# Patient Record
Sex: Female | Born: 1985 | Race: Black or African American | Marital: Single | Smoking: Never smoker
Health system: Southern US, Community
[De-identification: ages and names within clinical notes are randomized; demographics above are authoritative.]

## PROBLEM LIST (undated history)

## (undated) DIAGNOSIS — I10 Essential (primary) hypertension: Secondary | ICD-10-CM

## (undated) DIAGNOSIS — G35 Multiple sclerosis: Secondary | ICD-10-CM

## (undated) HISTORY — PX: ABDOMINAL SURGERY: SHX537

## (undated) HISTORY — DX: Multiple sclerosis: G35

---

## 2015-02-14 ENCOUNTER — Emergency Department
Admission: EM | Admit: 2015-02-14 | Discharge: 2015-02-14 | Disposition: A | Discharge status: Home / Self Care | Payer: Self-pay | Attending: Emergency Medicine | Admitting: Emergency Medicine

## 2015-02-14 ENCOUNTER — Encounter: Payer: Self-pay | Admitting: Emergency Medicine

## 2015-02-14 DIAGNOSIS — N760 Acute vaginitis: Secondary | ICD-10-CM | POA: Insufficient documentation

## 2015-02-14 DIAGNOSIS — A6009 Herpesviral infection of other urogenital tract: Secondary | ICD-10-CM | POA: Insufficient documentation

## 2015-02-14 DIAGNOSIS — Z3202 Encounter for pregnancy test, result negative: Secondary | ICD-10-CM | POA: Insufficient documentation

## 2015-02-14 DIAGNOSIS — R42 Dizziness and giddiness: Secondary | ICD-10-CM | POA: Insufficient documentation

## 2015-02-14 DIAGNOSIS — Z880 Allergy status to penicillin: Secondary | ICD-10-CM | POA: Insufficient documentation

## 2015-02-14 DIAGNOSIS — I10 Essential (primary) hypertension: Secondary | ICD-10-CM | POA: Insufficient documentation

## 2015-02-14 HISTORY — DX: Essential (primary) hypertension: I10

## 2015-02-14 LAB — COMPREHENSIVE METABOLIC PANEL
ALT: 21 U/L (ref 14–54)
AST: 19 U/L (ref 15–41)
Albumin: 4 g/dL (ref 3.5–5.0)
Alkaline Phosphatase: 69 U/L (ref 38–126)
Anion gap: 5 (ref 5–15)
BUN: 17 mg/dL (ref 6–20)
CHLORIDE: 105 mmol/L (ref 101–111)
CO2: 26 mmol/L (ref 22–32)
Calcium: 9.6 mg/dL (ref 8.9–10.3)
Creatinine, Ser: 1.03 mg/dL — ABNORMAL HIGH (ref 0.44–1.00)
Glucose, Bld: 102 mg/dL — ABNORMAL HIGH (ref 65–99)
POTASSIUM: 3.6 mmol/L (ref 3.5–5.1)
SODIUM: 136 mmol/L (ref 135–145)
Total Bilirubin: 0.5 mg/dL (ref 0.3–1.2)
Total Protein: 8.1 g/dL (ref 6.5–8.1)

## 2015-02-14 LAB — URINALYSIS COMPLETE WITH MICROSCOPIC (ARMC ONLY)
BILIRUBIN URINE: NEGATIVE
Glucose, UA: NEGATIVE mg/dL
Ketones, ur: NEGATIVE mg/dL
Leukocytes, UA: NEGATIVE
Nitrite: NEGATIVE
PH: 6 (ref 5.0–8.0)
PROTEIN: 30 mg/dL — AB
Specific Gravity, Urine: 1.027 (ref 1.005–1.030)

## 2015-02-14 LAB — CHLAMYDIA/NGC RT PCR (ARMC ONLY)
Chlamydia Tr: NOT DETECTED
N gonorrhoeae: NOT DETECTED

## 2015-02-14 LAB — CBC
HEMATOCRIT: 40.4 % (ref 35.0–47.0)
HEMOGLOBIN: 13.9 g/dL (ref 12.0–16.0)
MCH: 31.2 pg (ref 26.0–34.0)
MCHC: 34.3 g/dL (ref 32.0–36.0)
MCV: 91 fL (ref 80.0–100.0)
Platelets: 282 10*3/uL (ref 150–440)
RBC: 4.44 MIL/uL (ref 3.80–5.20)
RDW: 13.1 % (ref 11.5–14.5)
WBC: 7.3 10*3/uL (ref 3.6–11.0)

## 2015-02-14 LAB — WET PREP, GENITAL
Trich, Wet Prep: NEGATIVE — AB
Yeast Wet Prep HPF POC: NEGATIVE — AB

## 2015-02-14 LAB — LIPASE, BLOOD: LIPASE: 32 U/L (ref 11–51)

## 2015-02-14 LAB — POCT PREGNANCY, URINE: PREG TEST UR: NEGATIVE

## 2015-02-14 MED ORDER — ACYCLOVIR 400 MG PO TABS: 400.0000 mg | ORAL_TABLET | Freq: Every day | ORAL | Status: AC

## 2015-02-14 MED ORDER — SODIUM CHLORIDE 0.9 % IV SOLN
Freq: Once | INTRAVENOUS | Status: AC
  Administered 2015-02-14: 18:00:00 via INTRAVENOUS

## 2015-02-14 NOTE — ED Provider Notes (Signed)
[facility] Emergency Department Provider Note  ____________________________________________  Time seen: On arrival  I have reviewed the triage vital signs and the nursing notes.   HISTORY  Chief Complaint Fever; Dizziness; and Abdominal Pain    HPI Terry Ramos is a 29 y.o. female who presents with complaints of lower abdominal cramping with yellow discharge. She reports lesions on her vagina and she is very concerned about STD. She reports new sexual partner approximately 2 weeks ago with unprotected sexual intercourse. She denies nausea or vomiting. She is very anxious and concerned    Past Medical History  Diagnosis Date  . Hypertension     There are no active problems to display for this patient.   Past Surgical History  Procedure Laterality Date  . Abdominal surgery      Current Outpatient Rx  Name  Route  Sig  Dispense  Refill  . acyclovir (ZOVIRAX) 400 MG tablet   Oral   Take 1 tablet (400 mg total) by mouth 5 (five) times daily.   50 tablet   0     Allergies Penicillins  No family history on file.  Social History Social History  Substance Use Topics  . Smoking status: Never Smoker   . Smokeless tobacco: None  . Alcohol Use: No    Review of Systems  Constitutional: Negative for fever. Eyes: Negative for visual changes. ENT: Negative for sore throat   Genitourinary: Negative for dysuria. Musculoskeletal: Negative for back pain. Skin: Negative for rash. Neurological: Negative for headaches or focal weakness   ____________________________________________   PHYSICAL EXAM:  VITAL SIGNS: ED Triage Vitals  Enc Vitals Group     BP 02/14/15 1538 173/124 mmHg     Pulse Rate 02/14/15 1538 119     Resp 02/14/15 1538 19     Temp 02/14/15 1538 99.1 F (37.3 C)     Temp Source 02/14/15 1538 Oral     SpO2 02/14/15 1538 98 %     Weight 02/14/15 1538 160 lb (72.576 kg)     Height 02/14/15 1538 5\' 4"  (1.626 m)   Head Cir --      Peak Flow --      Pain Score 02/14/15 1542 8     Pain Loc --      Pain Edu? --      Excl. in [place]? --      Constitutional: Alert and oriented. Well appearing and in no distress. Eyes: Conjunctivae are normal.  ENT   Head: Normocephalic and atraumatic.   Mouth/Throat: Mucous membranes are moist. Cardiovascular: Normal rate, regular rhythm.  Respiratory: Normal respiratory effort without tachypnea nor retractions.  Gastrointestinal: Soft and non-tender in all quadrants. No distention. There is no CVA tenderness. GU: Significant clear liquid discharge, reddish lesions on the outside and inside of the vagina speculum exam was very painful for the patient. No CMT noted Musculoskeletal: Nontender with normal range of motion in all extremities. Neurologic:  Normal speech and language. No gross focal neurologic deficits are appreciated. Skin:  Skin is warm, dry and intact. No rash noted. Psychiatric: Mood and affect are normal. Patient exhibits appropriate insight and judgment.  ____________________________________________    LABS (pertinent positives/negatives)  Labs Reviewed  WET PREP, GENITAL - Abnormal; Notable for the following:    Yeast Wet Prep HPF POC NEGATIVE (*)    Trich, Wet Prep NEGATIVE (*)    Clue Cells Wet Prep HPF POC FEW (*)    WBC, Wet Prep HPF POC  MANY (*)    All other components within normal limits  COMPREHENSIVE METABOLIC PANEL - Abnormal; Notable for the following:    Glucose, Bld 102 (*)    Creatinine, Ser 1.03 (*)    All other components within normal limits  URINALYSIS COMPLETEWITH MICROSCOPIC ([facility] ONLY) - Abnormal; Notable for the following:    Color, Urine YELLOW (*)    APPearance CLEAR (*)    Hgb urine dipstick 2+ (*)    Protein, ur 30 (*)    Bacteria, UA RARE (*)    Squamous Epithelial / LPF 0-5 (*)    All other components within normal limits  CHLAMYDIA/NGC RT PCR ([facility] ONLY)  HSV CULTURE AND TYPING  LIPASE,  BLOOD  CBC  POC URINE PREG, ED  POCT PREGNANCY, URINE    ____________________________________________     ____________________________________________    RADIOLOGY I have personally reviewed any xrays that were ordered on this patient: None  ____________________________________________   PROCEDURES  Procedure(s) performed: none   ____________________________________________   INITIAL IMPRESSION / ASSESSMENT AND PLAN / ED COURSE  Pertinent labs & imaging results that were available during my care of the patient were reviewed by me and considered in my medical decision making (see chart for details).  Clinical exam is consistent with herpes genitalis. We will send cultures. Chlamydia and gonorrhea are negative. I have treated her presumptively for herpes with acyclovir. I have encouraged her to follow up with health department for further testing  ____________________________________________   FINAL CLINICAL IMPRESSION(S) / ED DIAGNOSES  Final diagnoses:  Vaginitis  Herpes genitalis in women     Lavonia Drafts, MD 02/14/15 2350

## 2015-02-14 NOTE — ED Notes (Addendum)
Pt reports fever x3 days, dizziness, lower abdominal cramping with yellow discharge. Pt reports new sexual partner 2 weekends ago. Pt reports her menstrual cycle is late. Pt also reports new bumps to pubic area.

## 2015-02-14 NOTE — ED Notes (Signed)
Patient with no complaints at this time. Respirations even and unlabored. Skin warm/dry. Discharge instructions reviewed with patient at this time. Patient given opportunity to voice concerns/ask questions. IV removed per policy and band-aid applied to site. Patient discharged at this time and left Emergency Department with steady gait.  

## 2015-02-14 NOTE — Discharge Instructions (Signed)
Herpes Simplex Test There are two common types of herpes simplex virus (HSV). These are classified as Type 1 (HSV1) or Type 2 (HSV2). Type 1 often causes cold sores on or around the mouth and sometimes on or around the eyes. Type 2 is commonly known as a sexually transmitted infection that causes sores in and around the genitals. Both types of herpes simplex can cause sores in different areas. There are two types of herpes simplex tests. These include:  Culture. This consists of collecting and testing a sample of fluid with a cotton swab from an open sore. This can only be done during an active infection (outbreak). Culture tests take several days to complete but are very accurate.  HSV blood tests. This test is not as accurate as a culture. However, HSV blood tests are faster than cultures, often providing a test result within one day.  HSV antibody test. This checks for the presence of antibodies against HSV in your blood. Antibodies are proteins your body makes to help fight infection.  HSV antigen test. This checks for the presence of the HSV germ (antigen) in your blood. Your health care provider may recommend you have a HSV test if:  He or she believes you have a HSV infection.  You have a weakened immune system (immunocompromised) and you have sores around your mouth or genitals that look like HSV eruptions.  You have a fever of unknown origin (FUO).  You are pregnant, have herpes, and are expecting to deliver a baby vaginally in the next 6-8 weeks. RESULTS It is your responsibility to obtain your test results. Ask the lab or department performing the test when and how you will get your results. Contact your health care provider to discuss any questions you have about your results. Range of Normal Values Ranges for normal values may vary among different labs and hospitals. You should always check with your health care provider after having lab work or other tests done to discuss whether  your values are considered within normal limits. Normal findings include:  No HSV antigen or antibodies present in your blood.  No HSV antigen present in cultured fluid. Meaning of Results Outside Normal Ranges The following test results may indicate that you have an active HSV infection:  Positive culture for HSV1 or HSV2.  Presence of HSV1 or HSV2 antigens in your blood.  Presence of certain HSV1 or HSV2 antibodies (IgM) in your blood. Discuss your test results with your health care provider. He or she will use the results to make a diagnosis and determine a treatment plan that is right for you.   This information is not intended to replace advice given to you by your health care provider. Make sure you discuss any questions you have with your health care provider.   Document Released: 04/30/2004 Document Revised: 04/18/2014 Document Reviewed: 08/13/2013 Elsevier Interactive Patient Education [employer].

## 2015-02-18 LAB — HSV CULTURE AND TYPING

## 2018-12-10 ENCOUNTER — Other Ambulatory Visit (HOSPITAL_COMMUNITY): Payer: Self-pay | Admitting: Ophthalmology

## 2018-12-10 ENCOUNTER — Other Ambulatory Visit: Payer: Self-pay | Admitting: Ophthalmology

## 2018-12-10 DIAGNOSIS — H5500 Unspecified nystagmus: Secondary | ICD-10-CM

## 2018-12-12 ENCOUNTER — Ambulatory Visit
Admission: RE | Admit: 2018-12-12 | Discharge: 2018-12-12 | Disposition: A | Discharge status: Home / Self Care | Payer: 59 | Source: Ambulatory Visit | Attending: Ophthalmology | Admitting: Ophthalmology

## 2018-12-12 ENCOUNTER — Other Ambulatory Visit: Payer: Self-pay

## 2018-12-12 DIAGNOSIS — H5500 Unspecified nystagmus: Secondary | ICD-10-CM | POA: Insufficient documentation

## 2018-12-12 LAB — POCT I-STAT CREATININE: Creatinine, Ser: 1.3 mg/dL — ABNORMAL HIGH (ref 0.44–1.00)

## 2018-12-12 IMAGING — MR MR HEAD WO/W CM
15 series · 48 of 48 positions shown · IV contrast (agent unspecified)
Comparison: None.

CLINICAL DATA: Nystagmus

EXAM:
MRI HEAD WITHOUT AND WITH CONTRAST
TECHNIQUE: Multiplanar, multiecho pulse sequences of the brain and surrounding
structures were obtained without and with intravenous contrast.
CONTRAST:  7 mL Gadovist IV

[Series 5: ax dwi_tracew · axial · 3.0mm · 0.60mm/px · z∈[-118,+37]mm · 4 of 48 slices shown]
[im 1/48]
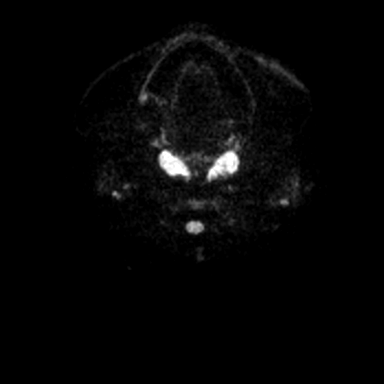
[im 16/48]
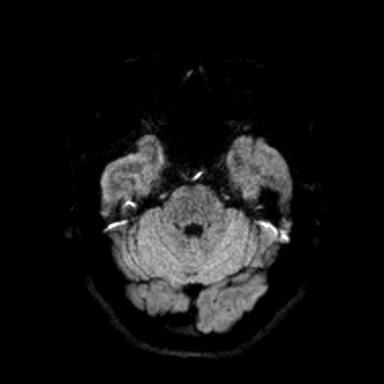
[im 32/48]
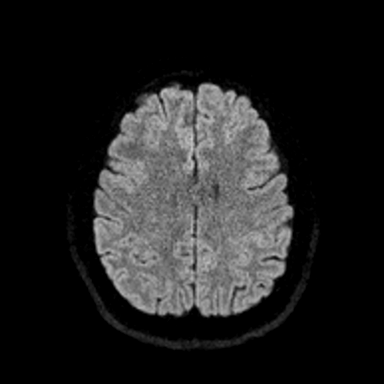
[im 48/48]
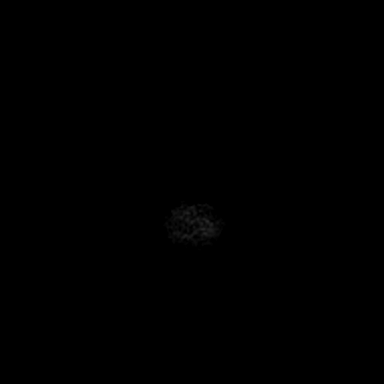

[Series 6: ax dwi_adc · axial · 3.0mm · 0.60mm/px · z∈[-118,+37]mm · 3 of 48 slices shown]
[im 1/48]
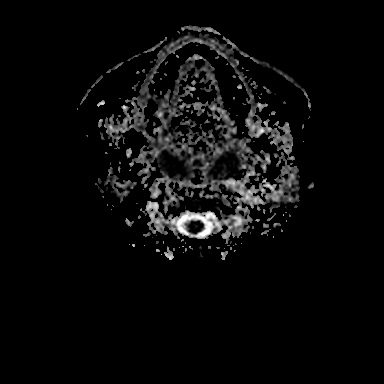
[im 24/48]
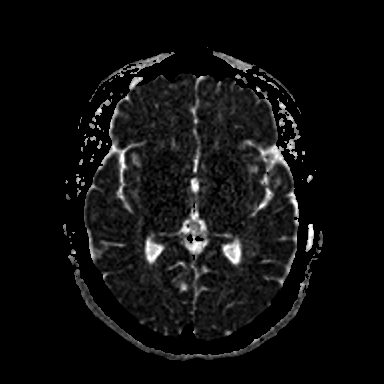
[im 48/48]
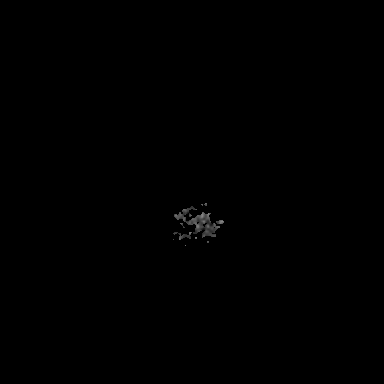

[Series 7: cor dwi_tracew · coronal · 5.0mm · 0.60mm/px · 2 of 40 slices shown]
[im 1/40]
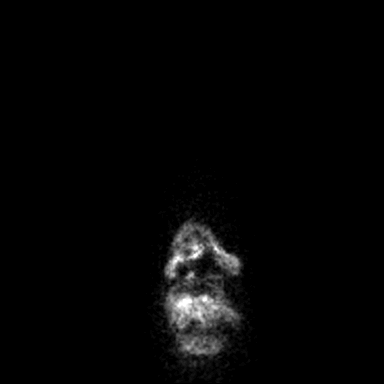
[im 40/40]
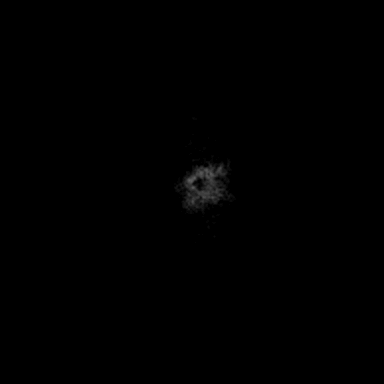

[Series 8: cor dwi_adc · coronal · 5.0mm · 0.60mm/px · 2 of 39 slices shown]
[im 1/39]
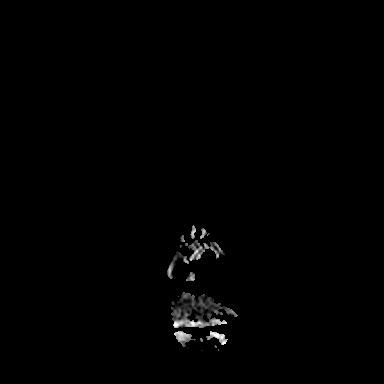
[im 39/39]
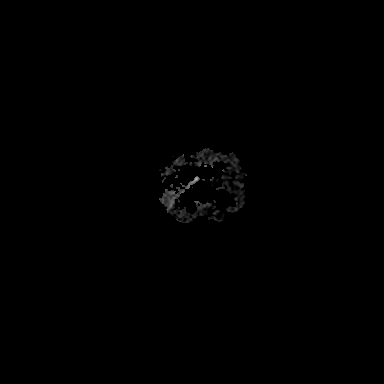

[Series 9: T1 · sagittal · 5.0mm · 0.62mm/px · 1 of 23 slices shown (1 of 2)]
[im 1/23]
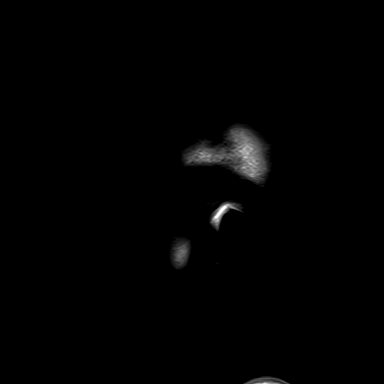

[Series 10: T2 · axial · 5.0mm · 0.53mm/px · 1 of 25 slices shown]
[im 1/25]
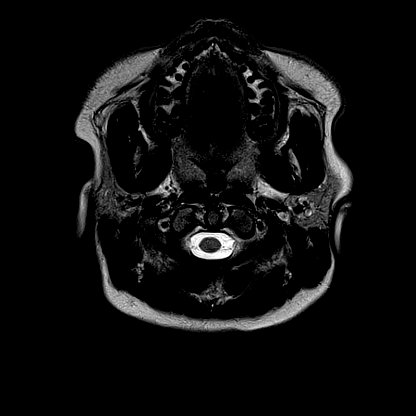

[Series 13: swi_images · axial · 3.0mm · 0.90mm/px · z∈[-129,+47]mm · 3 of 60 slices shown]
[im 1/60]
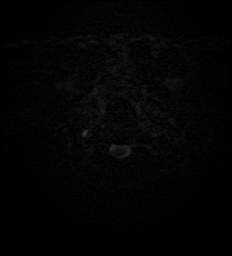
[im 30/60]
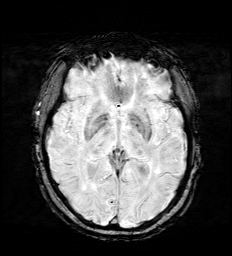
[im 60/60]
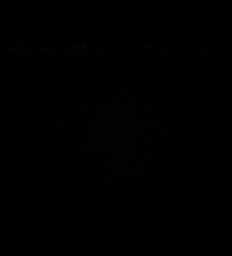

[Series 14: mip_images(sw) · axial · 24.0mm · 0.90mm/px · z∈[-119,+37]mm · 3 of 53 slices shown]
[im 1/53]
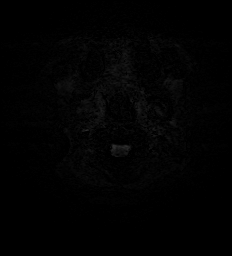
[im 27/53]
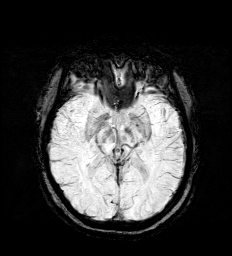
[im 53/53]
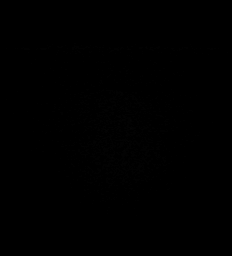

[Series 15: FLAIR · axial · 3.0mm · 0.53mm/px · z∈[-121,+40]mm · 3 of 55 slices shown (1 of 2)]
[im 1/55]
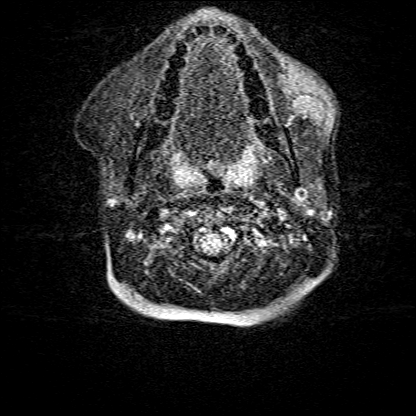
[im 28/55]
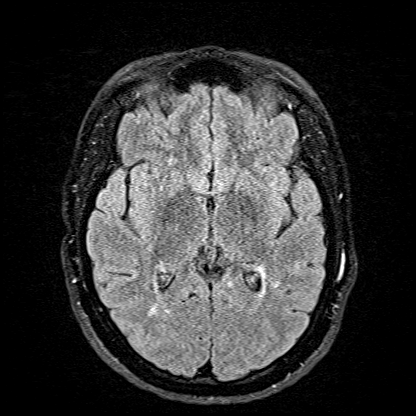
[im 55/55]
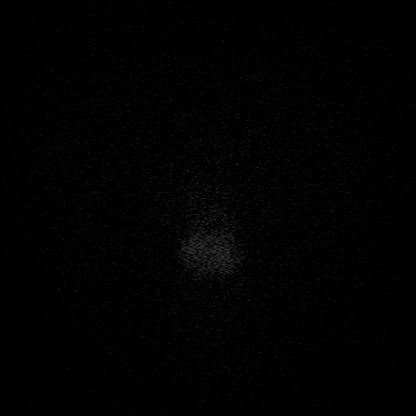

[Series 16: T1 · axial · 1.0mm · 0.98mm/px · z∈[-129,+46]mm · 10 of 176 slices shown (2 of 2)]
[im 1/176]
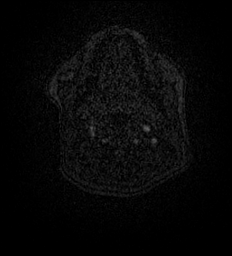
[im 20/176]
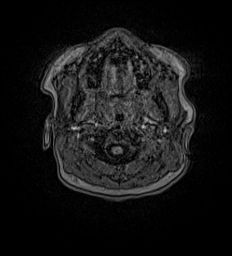
[im 39/176]
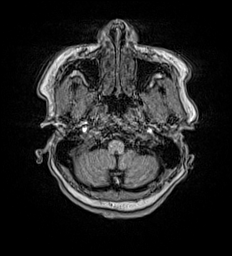
[im 59/176]
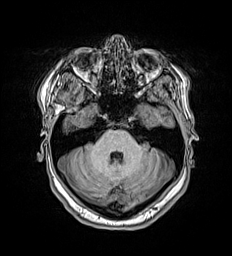
[im 78/176]
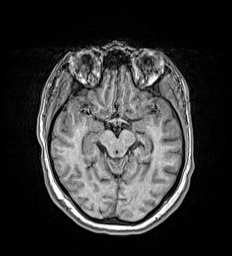
[im 98/176]
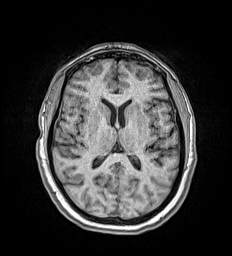
[im 117/176]
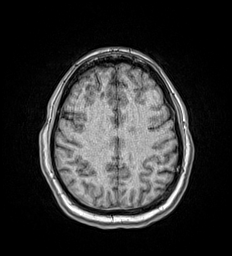
[im 137/176]
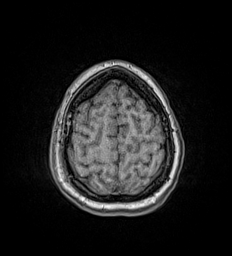
[im 156/176]
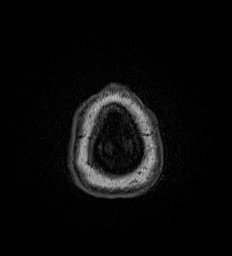
[im 176/176]
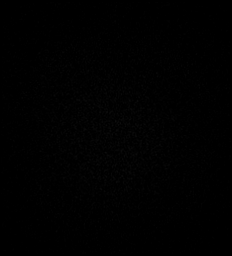

[Series 17: FLAIR · sagittal · 5.0mm · 0.94mm/px · 1 of 20 slices shown (2 of 2)]
[im 1/20]
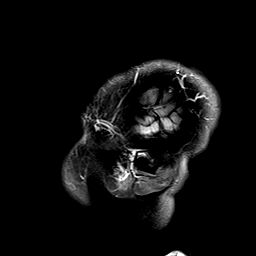

[Series 18: T2 post-contrast · coronal · 5.0mm · 0.57mm/px · 2 of 29 slices shown]
[im 1/29]
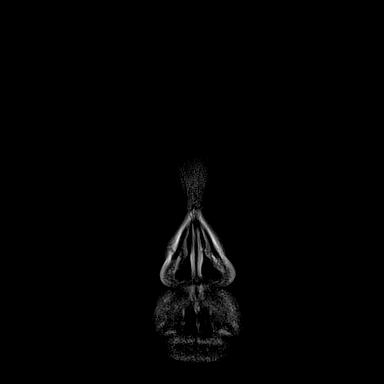
[im 29/29]
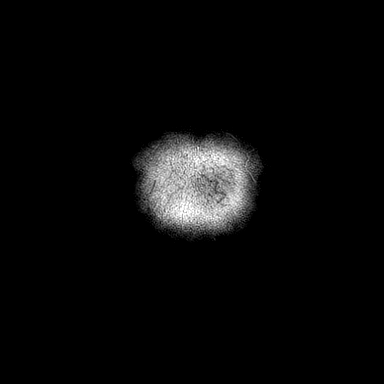

[Series 19: T1 post-contrast · axial · 1.0mm · 0.98mm/px · z∈[-129,+46]mm · 10 of 176 slices shown (1 of 3)]
[im 1/176]
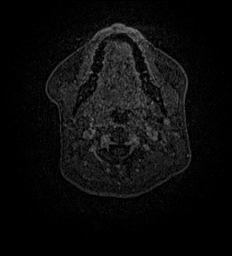
[im 20/176]
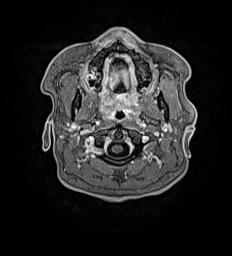
[im 39/176]
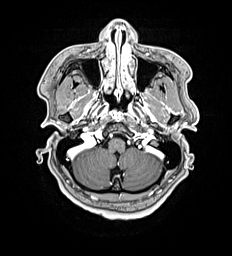
[im 59/176]
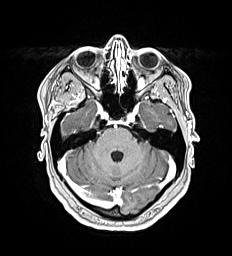
[im 78/176]
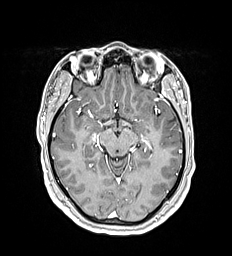
[im 98/176]
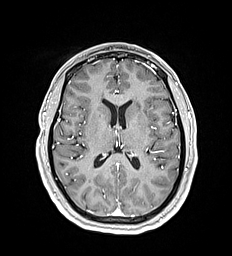
[im 117/176]
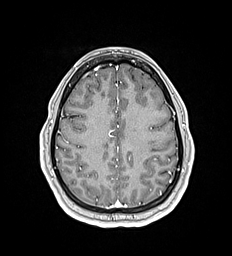
[im 137/176]
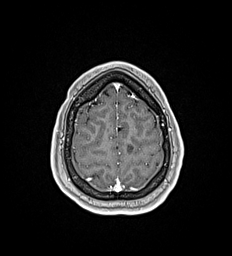
[im 156/176]
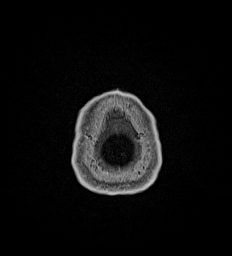
[im 176/176]
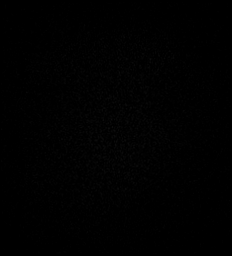

[Series 20: T1 post-contrast · coronal · 5.0mm · 0.57mm/px · 2 of 29 slices shown (2 of 3)]
[im 1/29]
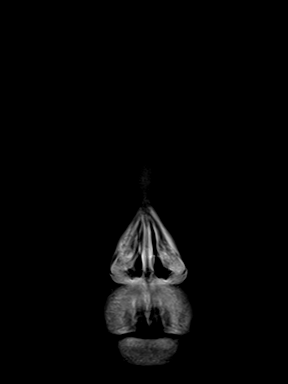
[im 29/29]
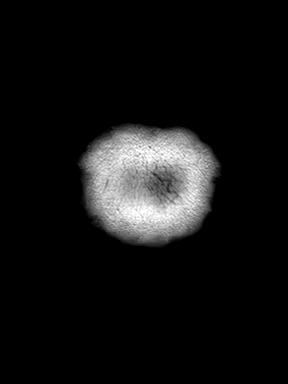

[Series 21: T1 post-contrast · sagittal · 5.0mm · 0.62mm/px · 1 of 23 slices shown (3 of 3)]
[im 1/23]
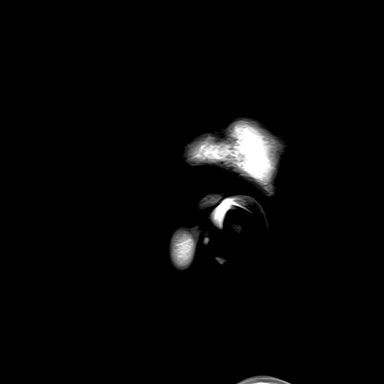

[48 of 48 positions shown; findings below may reference images not displayed]

FINDINGS: Brain: Multiple white matter lesions are present bilaterally.
Lesions are present in the periventricular and deep white matter. No
lesions show restricted diffusion or abnormal enhancement. No
lesions in the posterior fossa. Approximately 20 lesions are
identified

Ventricle size normal.  Negative for acute infarct or mass.

Vascular: Normal arterial flow voids.

Skull and upper cervical spine: Negative

Sinuses/Orbits: Negative

Other: None
IMPRESSION: Multiple white matter lesions are present. Given the symptoms and
patient's age, multiple sclerosis is most likely. No lesions are
present in the brainstem.

## 2018-12-12 MED ORDER — GADOBUTROL 1 MMOL/ML IV SOLN
7.0000 mL | Freq: Once | INTRAVENOUS | Status: AC | PRN
  Administered 2018-12-12: 10:00:00 7 mL via INTRAVENOUS

## 2019-11-22 ENCOUNTER — Emergency Department: Payer: 59

## 2019-11-22 ENCOUNTER — Other Ambulatory Visit: Payer: Self-pay

## 2019-11-22 ENCOUNTER — Emergency Department
Admission: EM | Admit: 2019-11-22 | Discharge: 2019-11-22 | Disposition: A | Discharge status: Home / Self Care | Payer: 59 | Attending: Emergency Medicine | Admitting: Emergency Medicine

## 2019-11-22 DIAGNOSIS — R0789 Other chest pain: Secondary | ICD-10-CM

## 2019-11-22 DIAGNOSIS — I10 Essential (primary) hypertension: Secondary | ICD-10-CM | POA: Diagnosis not present

## 2019-11-22 LAB — TROPONIN I (HIGH SENSITIVITY)
Troponin I (High Sensitivity): 5 ng/L (ref <18)
Troponin I (High Sensitivity): 4 ng/L (ref <18)

## 2019-11-22 LAB — BASIC METABOLIC PANEL
Anion gap: 8 (ref 5–15)
BUN: 20 mg/dL (ref 6–20)
CO2: 25 mmol/L (ref 22–32)
Calcium: 9 mg/dL (ref 8.9–10.3)
Chloride: 106 mmol/L (ref 98–111)
Creatinine, Ser: 0.96 mg/dL (ref 0.44–1.00)
GFR calc Af Amer: >60 mL/min (ref >60)
GFR calc non Af Amer: >60 mL/min (ref >60)
Glucose, Bld: 94 mg/dL (ref 70–99)
Potassium: 3.4 mmol/L — ABNORMAL LOW (ref 3.5–5.1)
Sodium: 139 mmol/L (ref 135–145)

## 2019-11-22 LAB — CBC
HCT: 37 % (ref 36.0–46.0)
Hemoglobin: 12.7 g/dL (ref 12.0–15.0)
MCH: 31 pg (ref 26.0–34.0)
MCHC: 34.3 g/dL (ref 30.0–36.0)
MCV: 90.2 fL (ref 80.0–100.0)
Platelets: 329 10*3/uL (ref 150–400)
RBC: 4.1 MIL/uL (ref 3.87–5.11)
RDW: 12.6 % (ref 11.5–15.5)
WBC: 9.1 10*3/uL (ref 4.0–10.5)
nRBC: 0 % (ref 0.0–0.2)

## 2019-11-22 IMAGING — CR DG CHEST 2V
1 series · 2 of 2 positions shown · non-contrast
Comparison: None.

CLINICAL DATA: Chest pressure for 2 days

EXAM:
CHEST - 2 VIEW

[Series 1: dg chest 2 view · 0.14mm/px · 2 of 2 slices shown]
[im 1/2]
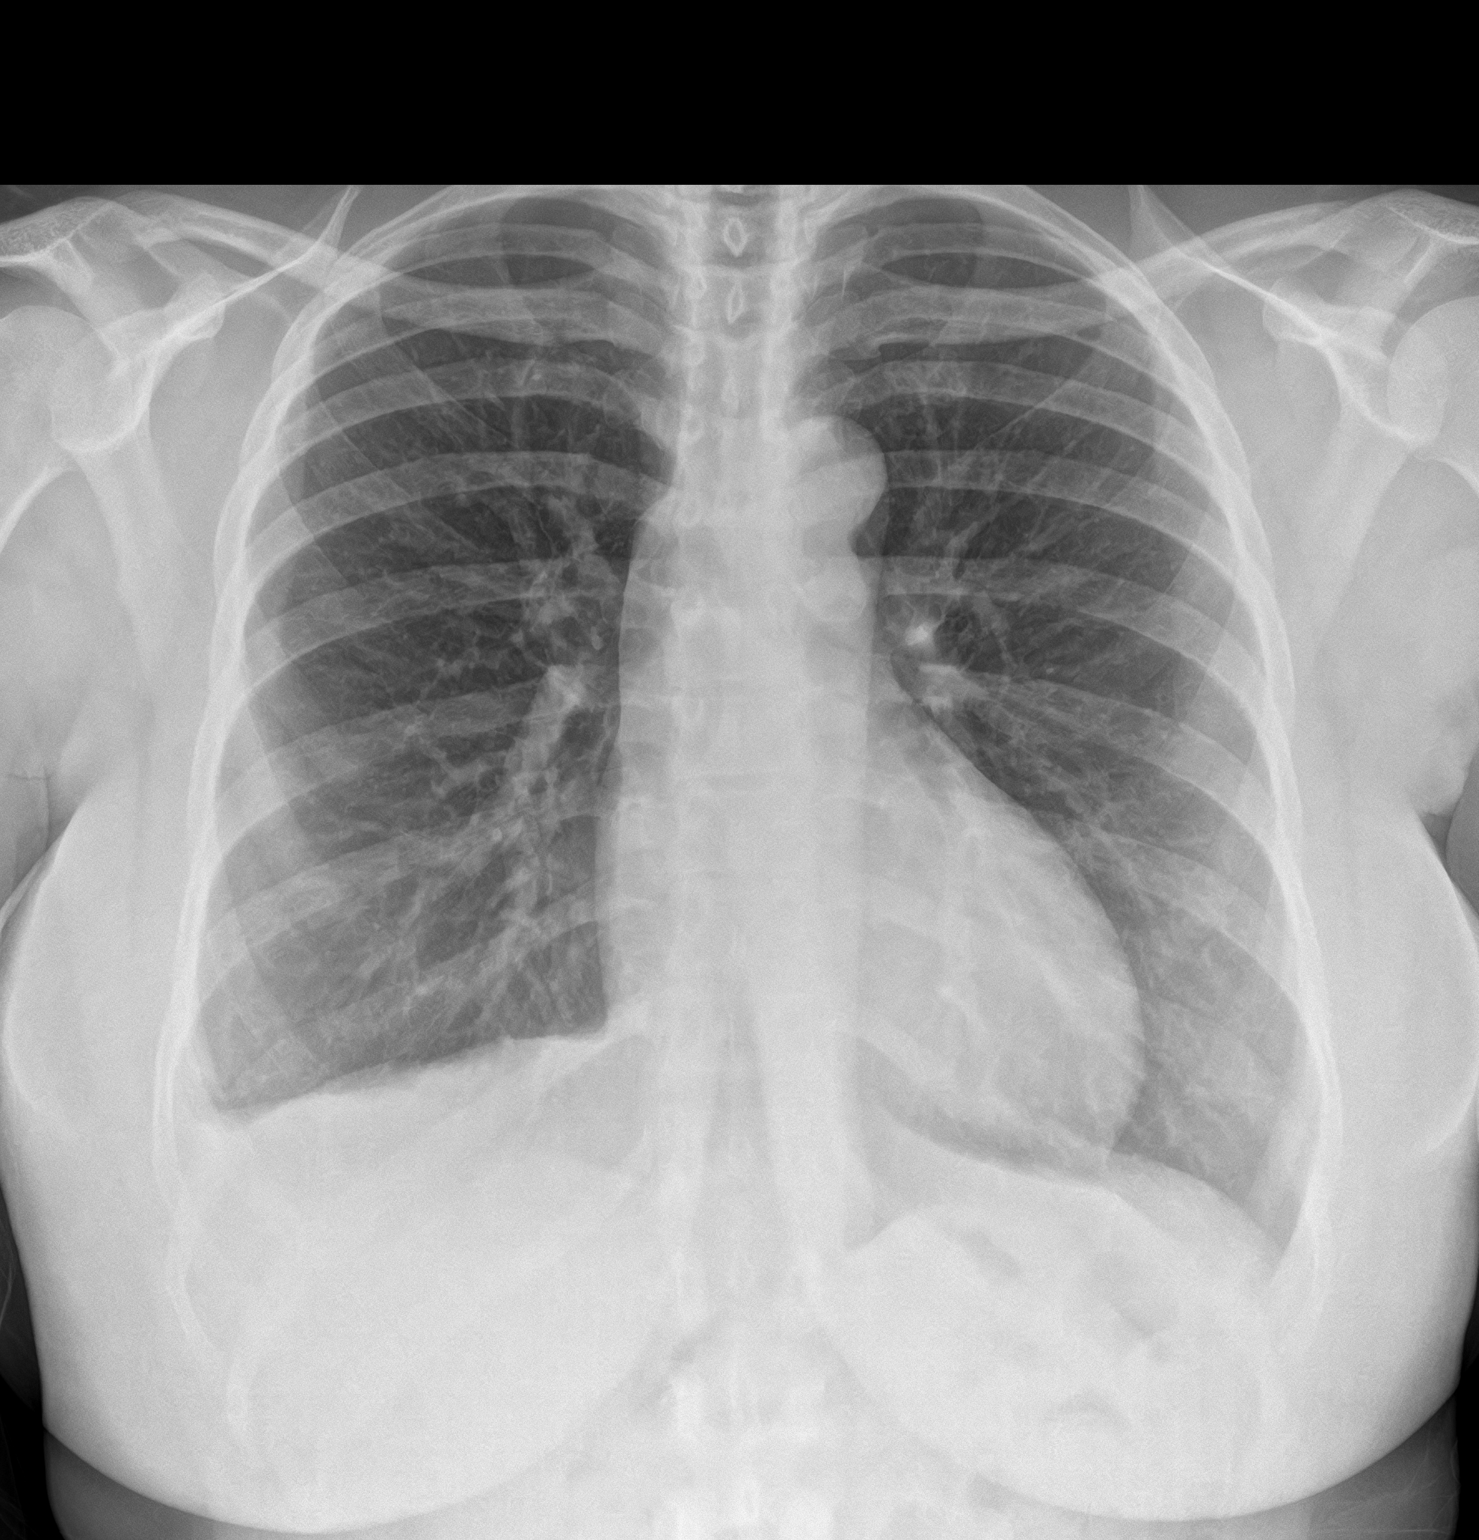
[im 2/2]
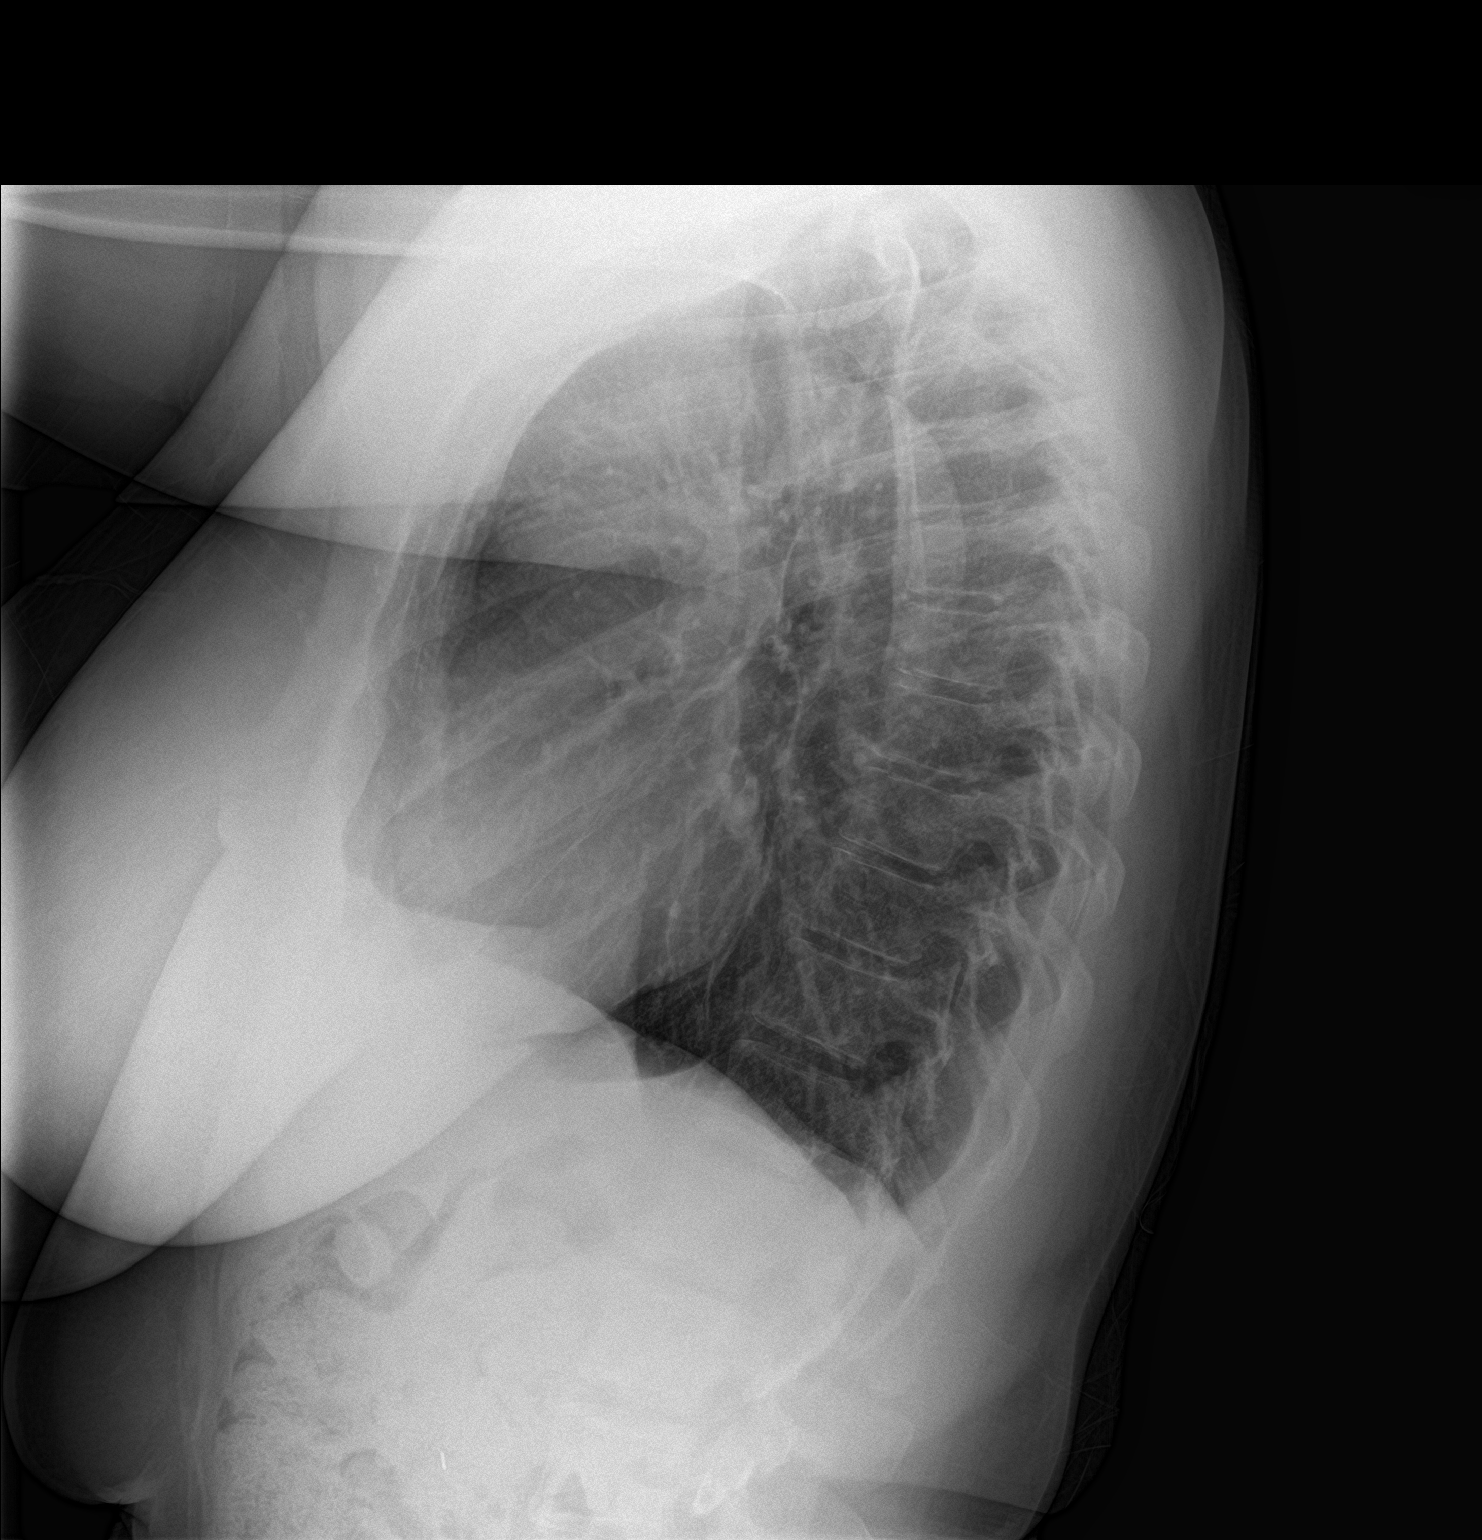

[2 of 2 positions shown; findings below may reference images not displayed]

FINDINGS: Cardiac shadow is within normal limits. The lungs are well aerated
bilaterally. Minimal blunting of the right costophrenic angle is
seen. No focal infiltrate is noted.
IMPRESSION: Minimal right pleural effusion.

## 2019-11-22 MED ORDER — SIMETHICONE 80 MG PO CHEW
80.0000 mg | CHEWABLE_TABLET | Freq: Four times a day (QID) | ORAL | 0 refills | Status: DC | PRN
Start: 2019-11-22 — End: 2020-05-12

## 2019-11-22 MED ORDER — CLONIDINE HCL 0.1 MG PO TABS
0.1000 mg | ORAL_TABLET | Freq: Once | ORAL | Status: AC
  Administered 2019-11-22: 0.1 mg via ORAL
  Filled 2019-11-22: qty 1

## 2019-11-22 MED ORDER — DICYCLOMINE HCL 10 MG/5ML PO SOLN
10.0000 mg | Freq: Once | ORAL | Status: DC
  Filled 2019-11-22: qty 5

## 2019-11-22 MED ORDER — DICYCLOMINE HCL 10 MG/5ML PO SOLN
10.0000 mg | Freq: Once | ORAL | Status: AC
  Administered 2019-11-22: 10 mg via ORAL
  Filled 2019-11-22 (×2): qty 5

## 2019-11-22 MED ORDER — AMLODIPINE BESYLATE 5 MG PO TABS
5.0000 mg | ORAL_TABLET | Freq: Once | ORAL | Status: AC
  Administered 2019-11-22: 5 mg via ORAL
  Filled 2019-11-22: qty 1

## 2019-11-22 MED ORDER — AMLODIPINE BESYLATE 5 MG PO TABS: 5.0000 mg | ORAL_TABLET | Freq: Every day | ORAL | 0 refills | Status: DC

## 2019-11-22 MED ORDER — ALUM & MAG HYDROXIDE-SIMETH 200-200-20 MG/5ML PO SUSP
30.0000 mL | Freq: Once | ORAL | Status: AC
  Administered 2019-11-22: 30 mL via ORAL
  Filled 2019-11-22: qty 30

## 2019-11-22 MED ORDER — OMEPRAZOLE 20 MG PO CPDR: 20.0000 mg | DELAYED_RELEASE_CAPSULE | Freq: Every day | ORAL | 0 refills | Status: DC

## 2019-11-22 MED ORDER — LIDOCAINE VISCOUS HCL 2 % MT SOLN
15.0000 mL | Freq: Once | OROMUCOSAL | Status: AC
  Administered 2019-11-22: 15 mL via ORAL
  Filled 2019-11-22: qty 15

## 2019-11-22 NOTE — ED Provider Notes (Signed)
[facility] Emergency Department Provider Note  ____________________________________________   First MD Initiated Contact with Patient 11/22/19 1933     (approximate)  I have reviewed the triage vital signs and the nursing notes.   HISTORY  Chief Complaint Chest Pain    HPI Terry Ramos is a 34 y.o. female  With h/o severe untreated HTN here with chest pain. Pt reports that after eating cheese mushrooms yesterday, she has had a feeling of pressure and need to belch in her substernal area. She has been trying to make herself burp with some improvement, but sx feel constant. She feels like something is going "up and down" in her lower chest, radiating up to her throat when she lays down. Pain is burning. Pain si not sharp, tearing, and was gradual in onset. No nausea, vomiting. No h/o similar issues. She states she has known HTN and does not take any medications. She tried to drink citrus to help herself burp and feels like this made her sx worse.        Past Medical History:  Diagnosis Date  . Hypertension     There are no problems to display for this patient.   Past Surgical History:  Procedure Laterality Date  . ABDOMINAL SURGERY      Prior to Admission medications   Medication Sig Start Date End Date Taking? Authorizing Provider  amLODipine (NORVASC) 5 MG tablet Take 1 tablet (5 mg total) by mouth daily. 11/22/19 11/21/20  Duffy Bruce, MD  omeprazole (PRILOSEC) 20 MG capsule Take 1 capsule (20 mg total) by mouth daily for 14 days. 11/22/19 12/06/19  Duffy Bruce, MD  simethicone (GAS-X) 80 MG chewable tablet Chew 1 tablet (80 mg total) by mouth 4 (four) times daily as needed for flatulence. 11/22/19 11/21/20  Duffy Bruce, MD    Allergies Penicillins  No family history on file.  Social History Social History   Tobacco Use  . Smoking status: Never Smoker  Substance Use Topics  . Alcohol use: No  . Drug use: Not on file     Review of Systems  Review of Systems  Constitutional: Negative for fatigue and fever.  HENT: Negative for congestion and sore throat.   Eyes: Negative for visual disturbance.  Respiratory: Positive for chest tightness. Negative for cough and shortness of breath.   Cardiovascular: Positive for chest pain.  Gastrointestinal: Positive for nausea. Negative for abdominal pain, diarrhea and vomiting.  Genitourinary: Negative for flank pain.  Musculoskeletal: Negative for back pain and neck pain.  Skin: Negative for rash and wound.  Neurological: Negative for weakness.     ____________________________________________  PHYSICAL EXAM:      VITAL SIGNS: ED Triage Vitals  Enc Vitals Group     BP 11/22/19 1314 (!) 179/131     Pulse Rate 11/22/19 1314 82     Resp 11/22/19 1314 18     Temp 11/22/19 1314 99 F (37.2 C)     Temp Source 11/22/19 1314 Oral     SpO2 11/22/19 1314 100 %     Weight 11/22/19 1315 164 lb (74.4 kg)     Height 11/22/19 1315 5\' 4"  (1.626 m)     Head Circumference --      Peak Flow --      Pain Score 11/22/19 1314 6     Pain Loc --      Pain Edu? --      Excl. in [place]? --      Physical  Exam Vitals and nursing note reviewed.  Constitutional:      General: She is not in acute distress.    Appearance: She is well-developed.  HENT:     Head: Normocephalic and atraumatic.  Eyes:     Conjunctiva/sclera: Conjunctivae normal.  Cardiovascular:     Rate and Rhythm: Normal rate and regular rhythm.     Heart sounds: Normal heart sounds. No murmur heard.  No friction rub.     Comments: Pulses 2+ and symmetric bilaterally Pulmonary:     Effort: Pulmonary effort is normal. No respiratory distress.     Breath sounds: Normal breath sounds. No wheezing or rales.  Abdominal:     General: There is no distension.     Palpations: Abdomen is soft.     Tenderness: There is no abdominal tenderness.     Comments: Minimal epigastric TTP. No RUQ TTP, no rebound or guarding.  Negative Murphy's.  Musculoskeletal:     Cervical back: Neck supple.  Skin:    General: Skin is warm.     Capillary Refill: Capillary refill takes less than 2 seconds.  Neurological:     Mental Status: She is alert and oriented to person, place, and time.     Motor: No abnormal muscle tone.       ____________________________________________   LABS (all labs ordered are listed, but only abnormal results are displayed)  Labs Reviewed  BASIC METABOLIC PANEL - Abnormal; Notable for the following components:      Result Value   Potassium 3.4 (*)    All other components within normal limits  CBC  POC URINE PREG, ED  TROPONIN I (HIGH SENSITIVITY)  TROPONIN I (HIGH SENSITIVITY)    ____________________________________________  EKG: Normal sinus rhythm, VR 95. PR 146, QRS 74, QTc 444. No acute St elevations or depressions. No ischemia or infarct. ________________________________________  RADIOLOGY All imaging, including plain films, CT scans, and ultrasounds, independently reviewed by me, and interpretations confirmed via formal radiology reads.  ED MD interpretation:   CXR: No focal findings, cardaic shadow normal, minimal R effusion noted  Official radiology report(s): DG Chest 2 View  Result Date: 11/22/2019 CLINICAL DATA:  Chest pressure for 2 days EXAM: CHEST - 2 VIEW COMPARISON:  None. FINDINGS: Cardiac shadow is within normal limits. The lungs are well aerated bilaterally. Minimal blunting of the right costophrenic angle is seen. No focal infiltrate is noted. IMPRESSION: Minimal right pleural effusion. Electronically Signed   By: Inez Catalina M.D.   On: 11/22/2019 14:00    ____________________________________________  PROCEDURES   Procedure(s) performed (including Critical Care):  Procedures  ____________________________________________  INITIAL IMPRESSION / MDM / [place] / ED COURSE  As part of my medical decision making, I reviewed the following  data within the [place] notes reviewed and incorporated, Old chart reviewed, Notes from prior ED visits, and [place] Controlled Substance Database       *Terry Ramos was evaluated in Emergency Department on 11/22/2019 for the symptoms described in the history of present illness. She was evaluated in the context of the global COVID-19 pandemic, which necessitated consideration that the patient might be at risk for infection with the SARS-CoV-2 virus that causes COVID-19. Institutional protocols and algorithms that pertain to the evaluation of patients at risk for COVID-19 are in a state of rapid change based on information released by regulatory bodies including the CDC and federal and state organizations. These policies and algorithms were followed during the patient's  care in the ED.  Some ED evaluations and interventions may be delayed as a result of limited staffing during the pandemic.*     Medical Decision Making:  Very pleasant 34 yo F with known HTN here with CP. CP is highly consistent with likely GI related esophagitis vs gastritis, considering it began after eating, relieves w/ burping, and is positional with radiation to throat when lying flat. Labs reviewed and are unremarkable - trop negative x 2, EKG is non-ischemic and I do not suspect ACS. Her pain is not sharp, tearing, severe, and began gradually, with symmetric pulses and equal BP in bl extremities - do not suspect dissection clinically. Renal function normal. Largest concern at this time is her ongoing severe HTN. She has a known history of this and has been documented similarly high in prior visits. She is relatively asymptomatic, but we discussed the risks of continued untreated severe HTN, and the need for PCP follow-up. After shared decision making as well as discussion with mother, will start on amlodipine 5 mg and have her f/u as an outpatient. Return precautions given. Pain improved with GI cocktail,  otherwise well appearing.  ____________________________________________  FINAL CLINICAL IMPRESSION(S) / ED DIAGNOSES  Final diagnoses:  Atypical chest pain  Severe hypertension     MEDICATIONS GIVEN DURING THIS VISIT:  Medications  dicyclomine (BENTYL) 10 MG/5ML solution 10 mg (has no administration in time range)  alum & mag hydroxide-simeth (MAALOX/MYLANTA) 200-200-20 MG/5ML suspension 30 mL (30 mLs Oral Given 11/22/19 2005)    And  lidocaine (XYLOCAINE) 2 % viscous mouth solution 15 mL (15 mLs Oral Given 11/22/19 2004)  cloNIDine (CATAPRES) tablet 0.1 mg (0.1 mg Oral Given 11/22/19 2058)  amLODipine (NORVASC) tablet 5 mg (5 mg Oral Given 11/22/19 2210)     ED Discharge Orders         Ordered    amLODipine (NORVASC) 5 MG tablet  Daily     Discontinue  Reprint     11/22/19 2149    omeprazole (PRILOSEC) 20 MG capsule  Daily     Discontinue  Reprint     11/22/19 2150    simethicone (GAS-X) 80 MG chewable tablet  4 times daily PRN     Discontinue  Reprint     11/22/19 2150           Note:  This document was prepared using Dragon voice recognition software and may include unintentional dictation errors.   Duffy Bruce, MD 11/22/19 2221

## 2019-11-22 NOTE — ED Triage Notes (Signed)
Pt states that she has been having pressure in her chest since yesterday, pt states that she feels like she needs to belch as well. Pt is hypertensive and states that she has been since the age of 76

## 2019-11-22 NOTE — ED Notes (Signed)
BP taken on both arms.   L arm 202/126 R arm 202/121  Pulses equal at 77 bpm, spo2 100%

## 2019-11-22 NOTE — Discharge Instructions (Signed)
It is VERY important for you to start treating your hypertension. I've prescribed a medicine to start but you should see a primary in the next 1-2 weeks. I've provided TWO numbers of local providers that should be able to see you.  Start the antacid and gas-x for your symptoms.  Return with any worsening pain.

## 2020-04-17 DIAGNOSIS — O0990 Supervision of high risk pregnancy, unspecified, unspecified trimester: Secondary | ICD-10-CM | POA: Insufficient documentation

## 2020-04-22 LAB — OB RESULTS CONSOLE HIV ANTIBODY (ROUTINE TESTING): HIV: NONREACTIVE

## 2020-04-22 LAB — OB RESULTS CONSOLE VARICELLA ZOSTER ANTIBODY, IGG: Varicella: IMMUNE

## 2020-04-22 LAB — OB RESULTS CONSOLE RPR: RPR: NONREACTIVE

## 2020-04-22 LAB — OB RESULTS CONSOLE HEPATITIS B SURFACE ANTIGEN: Hepatitis B Surface Ag: NEGATIVE

## 2020-04-22 LAB — OB RESULTS CONSOLE RUBELLA ANTIBODY, IGM: Rubella: IMMUNE

## 2020-04-22 LAB — OB RESULTS CONSOLE GC/CHLAMYDIA
Chlamydia: NEGATIVE
Gonorrhea: NEGATIVE

## 2020-04-27 DIAGNOSIS — F819 Developmental disorder of scholastic skills, unspecified: Secondary | ICD-10-CM | POA: Insufficient documentation

## 2020-05-07 ENCOUNTER — Ambulatory Visit: Payer: 59

## 2020-05-07 ENCOUNTER — Other Ambulatory Visit: Payer: Self-pay

## 2020-05-07 DIAGNOSIS — O10919 Unspecified pre-existing hypertension complicating pregnancy, unspecified trimester: Secondary | ICD-10-CM

## 2020-05-07 DIAGNOSIS — R638 Other symptoms and signs concerning food and fluid intake: Secondary | ICD-10-CM

## 2020-05-12 ENCOUNTER — Ambulatory Visit: Payer: 59 | Attending: Maternal & Fetal Medicine

## 2020-05-12 ENCOUNTER — Other Ambulatory Visit: Payer: Self-pay

## 2020-05-12 ENCOUNTER — Ambulatory Visit (HOSPITAL_BASED_OUTPATIENT_CLINIC_OR_DEPARTMENT_OTHER): Payer: 59 | Admitting: Maternal & Fetal Medicine

## 2020-05-12 DIAGNOSIS — Z880 Allergy status to penicillin: Secondary | ICD-10-CM | POA: Diagnosis not present

## 2020-05-12 DIAGNOSIS — O99352 Diseases of the nervous system complicating pregnancy, second trimester: Secondary | ICD-10-CM | POA: Diagnosis not present

## 2020-05-12 DIAGNOSIS — O99353 Diseases of the nervous system complicating pregnancy, third trimester: Secondary | ICD-10-CM | POA: Insufficient documentation

## 2020-05-12 DIAGNOSIS — R638 Other symptoms and signs concerning food and fluid intake: Secondary | ICD-10-CM

## 2020-05-12 DIAGNOSIS — Z7982 Long term (current) use of aspirin: Secondary | ICD-10-CM | POA: Insufficient documentation

## 2020-05-12 DIAGNOSIS — O10912 Unspecified pre-existing hypertension complicating pregnancy, second trimester: Secondary | ICD-10-CM

## 2020-05-12 DIAGNOSIS — Z3A16 16 weeks gestation of pregnancy: Secondary | ICD-10-CM | POA: Diagnosis not present

## 2020-05-12 DIAGNOSIS — Z3A20 20 weeks gestation of pregnancy: Secondary | ICD-10-CM | POA: Diagnosis not present

## 2020-05-12 DIAGNOSIS — O99213 Obesity complicating pregnancy, third trimester: Secondary | ICD-10-CM | POA: Insufficient documentation

## 2020-05-12 DIAGNOSIS — O10919 Unspecified pre-existing hypertension complicating pregnancy, unspecified trimester: Secondary | ICD-10-CM

## 2020-05-12 DIAGNOSIS — Z369 Encounter for antenatal screening, unspecified: Secondary | ICD-10-CM

## 2020-05-12 DIAGNOSIS — G35 Multiple sclerosis: Secondary | ICD-10-CM | POA: Diagnosis not present

## 2020-05-12 DIAGNOSIS — Z79899 Other long term (current) drug therapy: Secondary | ICD-10-CM | POA: Diagnosis not present

## 2020-05-12 DIAGNOSIS — O99212 Obesity complicating pregnancy, second trimester: Secondary | ICD-10-CM

## 2020-05-12 IMAGING — US US MFM OB COMP +14 WKS
1 series · 14 of 28 positions shown · non-contrast
Comparison: none

[Series 1: us mfm ob comp +14 wks · 14 of 75 slices shown]
[im 3/75]
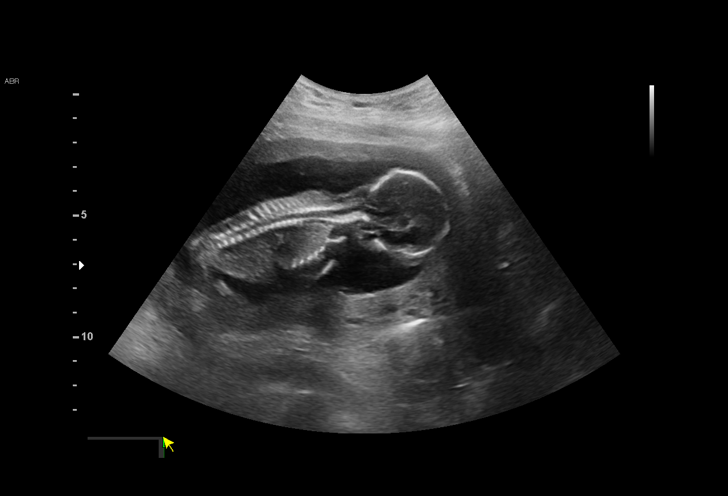
[im 9/75]
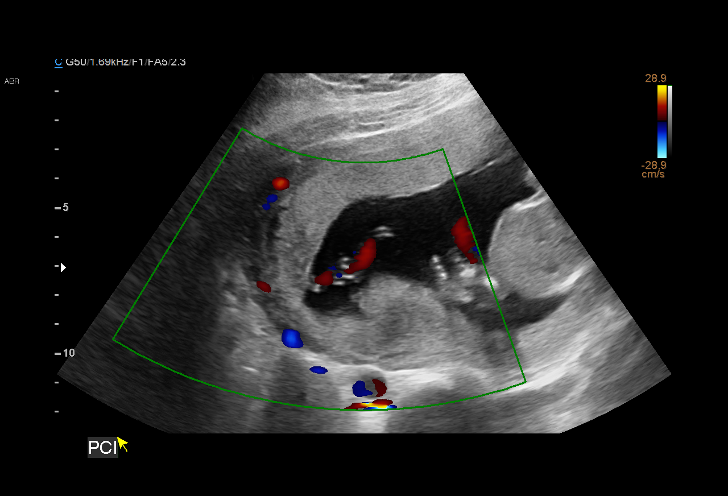
[im 14/75]
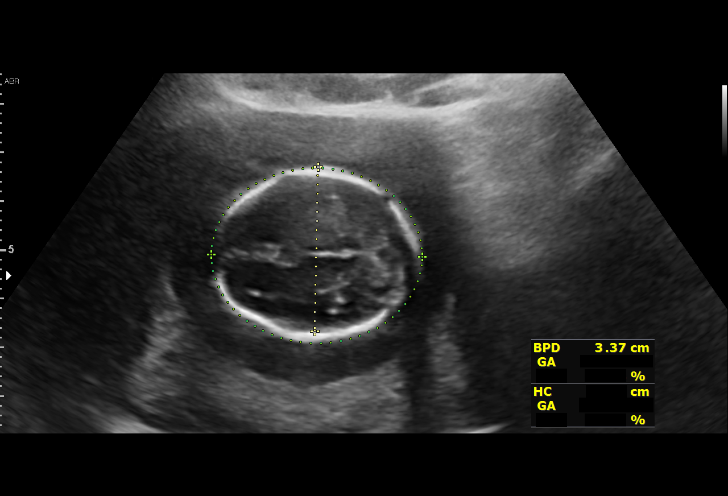
[im 20/75]
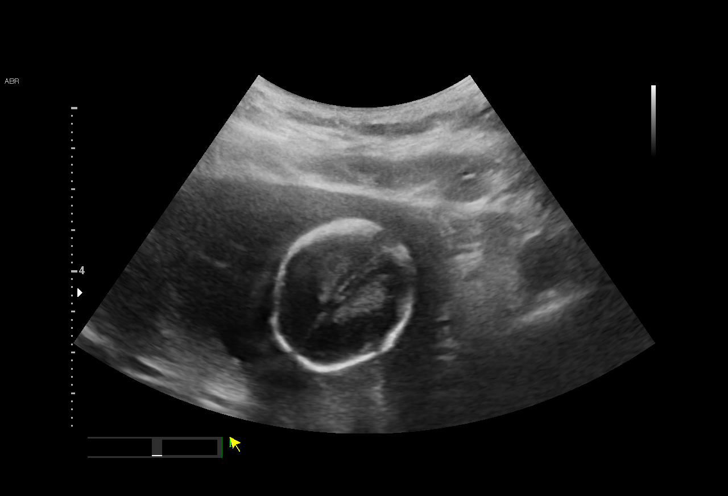
[im 25/75]
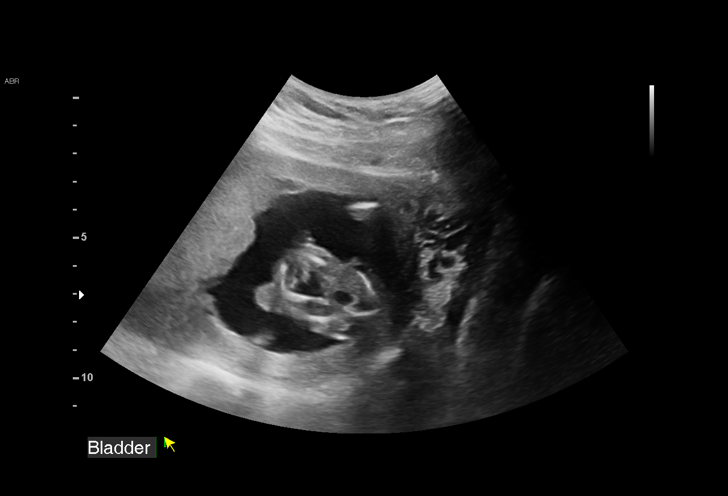
[im 31/75]
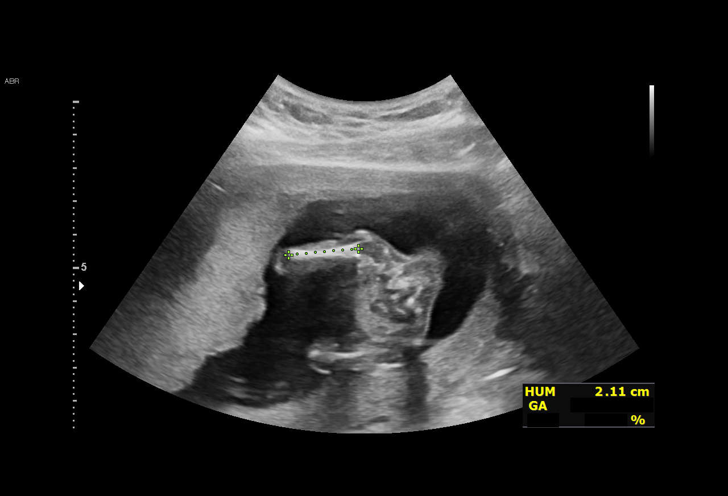
[im 36/75]
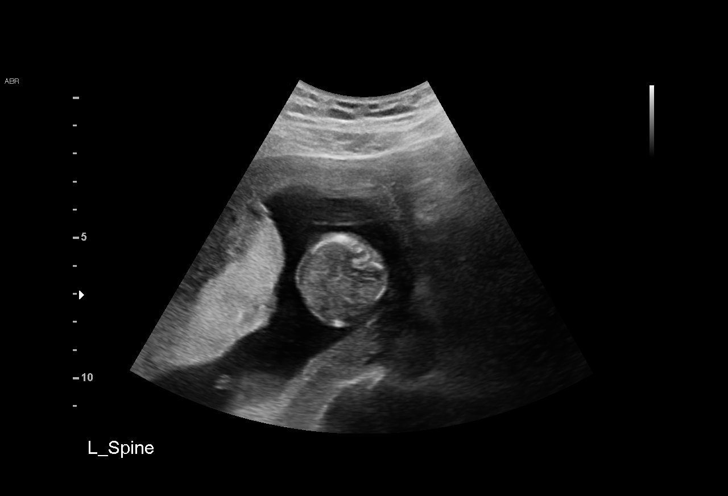
[im 42/75]
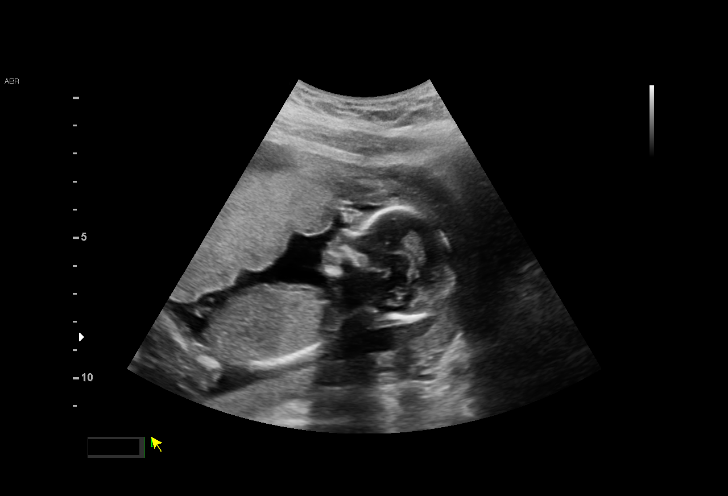
[im 47/75]
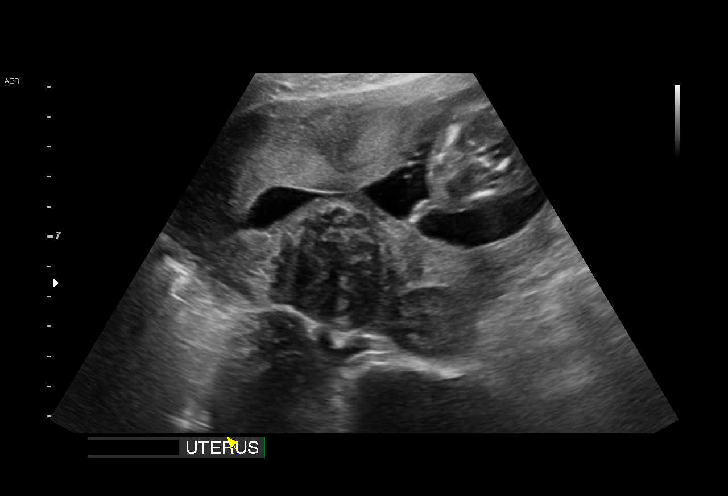
[im 53/75]
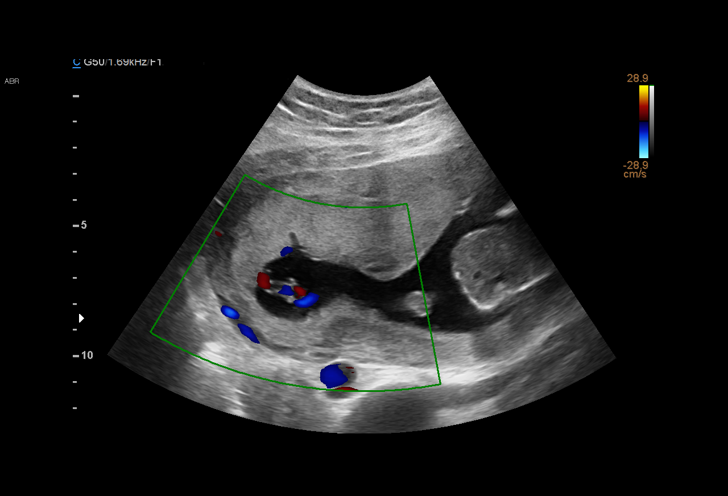
[im 58/75]
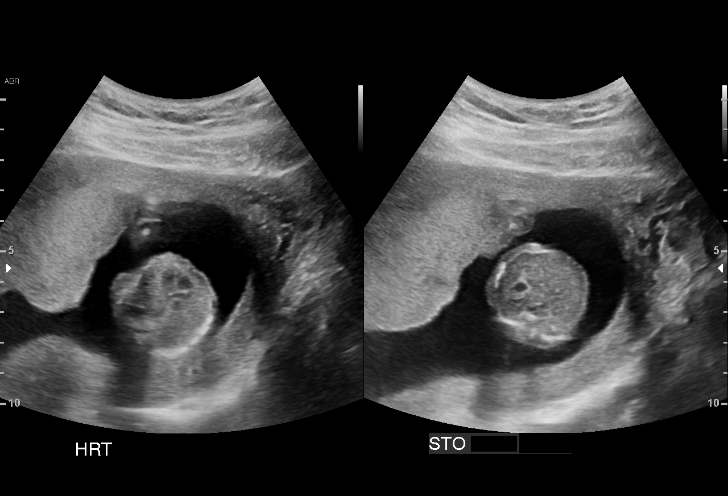
[im 64/75]
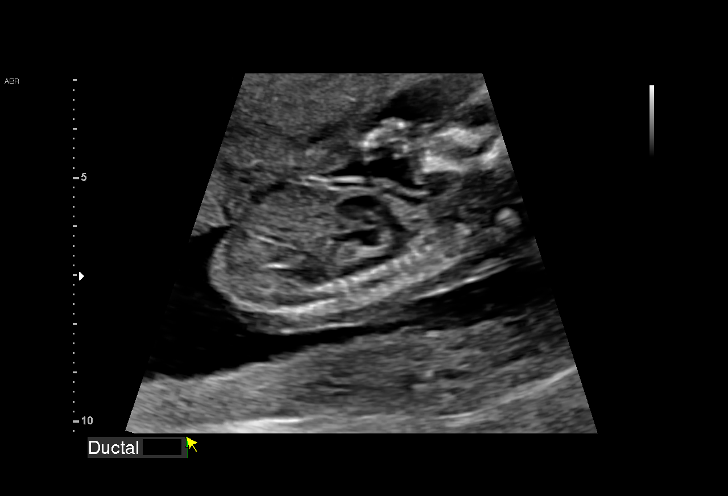
[im 69/75]
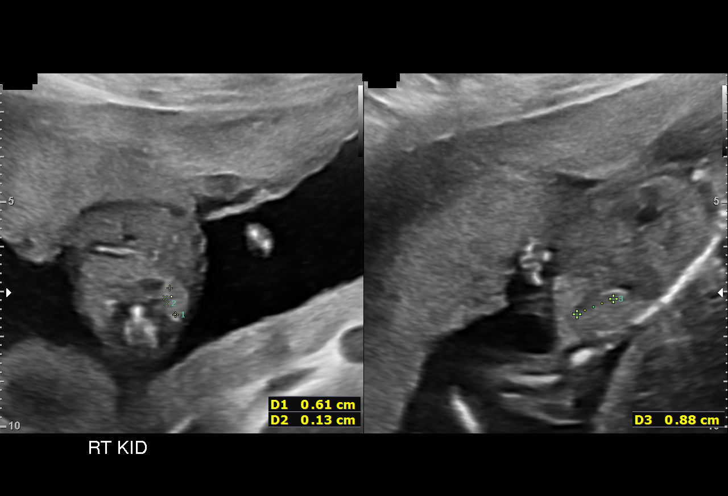
[im 75/75]
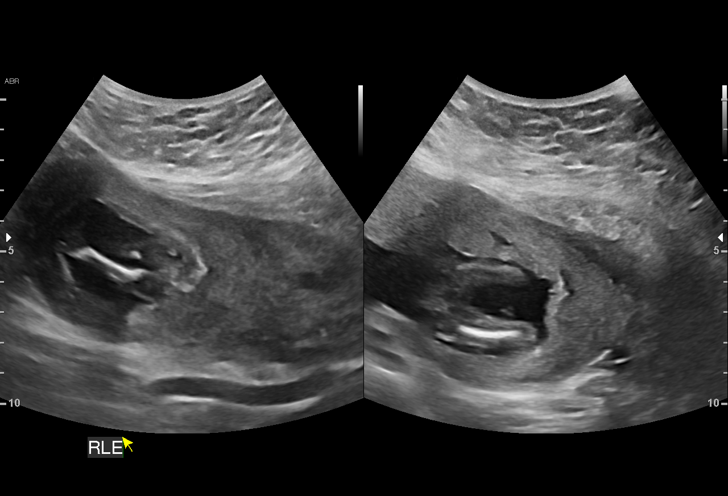

[14 of 28 positions shown; findings below may reference images not displayed]

1  US MFM OB COMP + 14 WK                76805.01    VERA

Indications

 16 weeks gestation of pregnancy
 Obesity complicating pregnancy                 [MY] [MY]
 Hypertension - Chronic/Pre-existing            [MY]
Fetal Evaluation

 Num Of Fetuses:         1
 Fetal Heart Rate(bpm):  153
 Cardiac Activity:       Observed
 Presentation:           Cephalic
 Placenta:               Anterior
 P. Cord Insertion:      Marginal insertion suspected.

                             Largest Pocket(cm)

Biometry

 BPD:      33.5  mm     G. Age:  16w 3d         66  %    CI:        73.76   %    70 - 86
                                                         FL/HC:      18.1   %    13.3 -
 HC:      123.9  mm     G. Age:  16w 2d         48  %    HC/AC:      1.19        1.05 -
 AC:      104.4  mm     G. Age:  16w 3d         66  %    FL/BPD:     66.9   %
 FL:       22.4  mm     G. Age:  16w 5d         74  %    FL/AC:      21.5   %    20 - 24
 HUM:      22.4  mm     G. Age:  16w 6d         77  %
 CER:      15.1  mm     G. Age:  15w 6d         19  %
 NFT:       1.9  mm
 LV:        5.8  mm
 CM:        3.1  mm

 Est. FW:     159  gm      0 lb 6 oz     76  %
Gestational Age
 LMP:           12w 1d        Date:  [DATE]                 EDD:   [DATE]
 U/S Today:     16w 3d                                        EDD:   [DATE]
 Best:          16w 0d     Det. By:  Early Ultrasound         EDD:   [DATE]
                                     ([DATE])
Anatomy

 Cranium:               Appears normal         Aortic Arch:            Appears normal
 Cavum:                 Appears normal         Ductal Arch:            Appears normal
 Ventricles:            Appears normal         Diaphragm:              Appears normal
 Choroid Plexus:        Appears normal         Stomach:                Appears normal, left
                                                                       sided
 Cerebellum:            Appears normal         Abdomen:                Appears normal
 Posterior Fossa:       Appears normal         Abdominal Wall:         Appears nml (cord
                                                                       insert, abd wall)
 Nuchal Fold:           Appears normal         Cord Vessels:           Appears normal (3
                                                                       vessel cord)
 Face:                  Profile appears        Kidneys:                Not well visualized
                        normal
 Lips:                  Not well visualized    Bladder:                Appears normal
 Heart:                 Not well visualized    Spine:                  Not well visualized
 RVOT:                  Not well visualized    Upper Extremities:      Appears normal
 LVOT:                  Not well visualized    Lower Extremities:      Appears normal
Cervix Uterus Adnexa

 Cervix
 Length:            3.9  cm.
Myomas

 Site                     L(cm)      W(cm)      D(cm)       Location
 Posterior
 Posterior

 Blood Flow                  RI       PI       Comments

Impression

 Single intrauterine pregnancy with measurements consistent
 with dates.
 Today's exam appeared normal however, given early
 gestational age some views cannot be cleared at this time.
 Follow up detailed exam to evaluate the heart is scheduled
 between 18-20 weeks.

 An MFM consultation was performed please see [REDACTED] for
 details.
Recommendations

 Follow up detailed anatomy at 18-20 weeks and reassess the
 placental cord insertion.

## 2020-05-12 NOTE — Progress Notes (Signed)
Terry Ramos is a 35 yo G1P0 who is here at 42 w 0 d in consultation at the request of [name] for elevated BMI and chronic hypertension.  She is dated by a 9 week ultrasound that was inconsistent with her LMP EDD given her a new EDD of 10/27/20  Terry Ramos is taking low dose ASA. Most recent cmp, cbc, HbA1c are normal. On 04/22/20 she had elevated ANA antibodies, TSH was subclinically low suggestive of physiology in pregnancy related TSH changes.   She is doing well without complaints she denies vaginal bleeding loss of fluid or uterine contractions.  She denies headache or vaginal bleeding.   Her pregnancy related issues include the following:  1) Chronic hypertension: Terry Ramos has known elevated blood pressure typically in the range of 144/ 90's - 100's. It uncertain if she is taking Norvasc or procardia at this time. She denies headache or vision changes. She reports carrying the diagnosis of CHTN since the age of   . Her most recent labs suggest no evidence of renal involvement. She had an EKG on 8.2021-suspected infarct of undetermined aged. Troponin's at that time were normal.    2) Multiple sclerosis: She recently had a consultation with Neurology on 04/21/20 by Dr. Jennings Books at [facility]. She was diagnosed with Multiple sclerosis due to a MRI with white matter lesions on 12/12/2018 with preceding right internuclear Ophthalmoplegia which has improved according to Terry Ramos after taking Bcomplex vitamins.. The full evaluation, referrals and treatment was not accomplished according to the notes as Terry Ramos had financial and logist barriers. She is currently doing well her symptoms include. She and her mother conveyed that the will follow up soon during pregnancy.  Vitals with BMI 05/12/2020 05/12/2020 11/22/2019  Height - 5\' 4"  -  Weight - 179 lbs -  BMI - 16.10 -  Systolic 960 454 098  Diastolic 94 119 147  Pulse - 100 64     OB History  Gravida Para  Term Preterm AB Living  1 0 0 0 0 0  SAB IAB Ectopic Multiple Live Births  0 0 0 0 0    # Outcome Date GA Lbr Len/2nd Weight Sex Delivery Anes PTL Lv  1 Current            Past Medical History:  Diagnosis Date  . Hypertension   . Multiple sclerosis ([place])    Social History   Socioeconomic History  . Marital status: Single    Spouse name: Not on file  . Number of children: Not on file  . Years of education: Not on file  . Highest education level: Not on file  Occupational History  . Not on file  Tobacco Use  . Smoking status: Never Smoker  . Smokeless tobacco: Never Used  Vaping Use  . Vaping Use: Never used  Substance and Sexual Activity  . Alcohol use: No  . Drug use: Never  . Sexual activity: Not Currently  Other Topics Concern  . Not on file  Social History Narrative  . Not on file   Social Determinants of Health   Financial Resource Strain: Not on file  Food Insecurity: Not on file  Transportation Needs: Not on file  Physical Activity: Not on file  Stress: Not on file  Social Connections: Not on file  Intimate Partner Violence: Not on file   Family History  Problem Relation Age of Onset  . Hypertension Mother   . Hyperlipidemia Mother   .  Osteoarthritis Mother   . Alopecia Father   . Hypertension Maternal Aunt   . Schizophrenia Maternal Aunt   . Hyperlipidemia Maternal Aunt   . Hyperlipidemia Maternal Uncle   . Irritable bowel syndrome Sister   . Diabetes Maternal Grandmother   . Multiple sclerosis Maternal Grandmother   . Kidney disease Maternal Grandmother   . Prostate cancer Maternal Grandfather   . Diabetes Paternal Grandmother   . Heart disease Paternal Grandmother   . Stomach cancer Paternal Grandfather   . Colitis Maternal Aunt   . Sickle cell anemia Maternal Aunt   . Hypertension Maternal Aunt   . Diabetes Maternal Uncle    Allergies  Allergen Reactions  . Penicillins     Did it involve swelling of the face/tongue/throat, SOB, or low  BP? No Did it involve sudden or severe rash/hives, skin peeling, or any reaction on the inside of your mouth or nose? Yes Did you need to seek medical attention at a hospital or doctor's office? Unknown When did it last happen? If all above answers are "NO", may proceed with cephalosporin use.     Today's imaging: Appears normal with measurements consistent with dates. Given early gestational age some views cannot be cleared at this time. Follow up detailed exam to evaluate the heart is scheduled between 18-20 weeks.   Impression/Counseling  1) I discussed with Terry Ramos the normal nature of todays ultrasound. Some views were suboptimally seen however, there were markers of aneuploidy observed.  2) Regarding her chronic hypertension- Terry Ramos appears less organized regarding her medication dosage and timing of medication. I reviewed the definition of chronic hypertension having recorded blood pressure > 140/90 prior to [redacted] weeks gestation.   I discussed the risk to the pregnancy including fetal growth restriction, maternal stroke, preeclampsia, placental abruption and stillbirth when not controlled.   I discussed that control includes maintaining BP < 150/105 mmHg. Her notes suggests that she is being treated with Procardia 30 mg XL daily where as Ms. More believes that she is taking Norvasc daily with uncertain dose. I conveyed that my preferred therapy would be Procardia 30 mg Xl in my experience it appears effective in most and has single dosing with some renal protection properties.   Nevertheless, I explained that fetal monitoring includes serial growth exams every four weeks and to initiate weekly antenatal testing at 32 weeks. Consider delivery between 37-39 weeks.   Additional labs should include Urinary protein creatinine ratio and EKG.  Lastly, I reaffirmed taking daily ASA for preeclampsia prevention.  3) Multiple sclerosis (MS) is a demyelinating disease that  affects the central nervous system at different levels and at varying times. It is a relatively common neurologic disease among young adults, peaking at age 64. The prevalence in the [place] is 1 per 1000. Women are affected twice as often as men.  Pregnancy itself may exert a short-term beneficial effect on the course of MS, including fewer, less severe relapses, especially in the third trimester. However, this protection is lost in the postpartum period.   Postpartum exacerbation is reported to increase 20-40% during the first 6 months after delivery. Despite the increase in disease activity postpartum, there does not appear to be an increase in long-term disability related to pregnancy. The relative immunosuppression associated with pregnancy may play a role in pregnancy related changes seen in MS. Children of MS mothers have a 3% risk of developing MS compared with a 0.1% risk seen in the general population.  In general, patients should be monitored for evidence of increased disease activity and the risks of therapy weighed against the potential concerns associated with lack of information. In those patients with urinary tract involvement, regular screening for asymptomatic bacteruria should take place. If the patient is on prolonged corticosteroids, then stress dose steroids are recommended during labor. Additionally, the use of spinal, epidural and general anesthesia can all be used safely. Breast-feeding is encouraged encouraged.   3) Regarding genetic screening Ms. [place] is uncertain regarding genetic screening there are orders listed in Epic. At this time we offered to draw an NIPT between prior to the next visit or it can be drawn at her next appointment.   4) Regarding elevated BMI we discussed the increased risk for gestational diabetes, preeclampsia, cesarean delivery, fetal malformations, post delivery infection and thrombosis in pregnancy we discussed continued exercise and  monitoring nutrition.   We also discussed the recommended pregnancy weight gain of 11-20 lbs.  Recommendations: -Serial growth exams every 4 weeks  -Initiate weekly antenatal testing at 32 weeks -Continue daily low dose ASA  - NIPT at next visit - Follow up detailed ultrasound in 4 weeks.  All questions answered  I spent 45 minutes with > 50% in face to face consultation

## 2020-06-04 ENCOUNTER — Other Ambulatory Visit: Payer: Self-pay

## 2020-06-04 DIAGNOSIS — O10912 Unspecified pre-existing hypertension complicating pregnancy, second trimester: Secondary | ICD-10-CM

## 2020-06-04 DIAGNOSIS — O99212 Obesity complicating pregnancy, second trimester: Secondary | ICD-10-CM

## 2020-06-09 ENCOUNTER — Ambulatory Visit: Payer: 59

## 2020-06-09 NOTE — Progress Notes (Deleted)
Pt did not show for Maternal Fetal Care appt. @ [facility] today.

## 2020-07-28 ENCOUNTER — Other Ambulatory Visit: Payer: Self-pay

## 2020-07-28 ENCOUNTER — Ambulatory Visit: Payer: Commercial Managed Care - HMO | Attending: Maternal & Fetal Medicine

## 2020-07-28 DIAGNOSIS — O99212 Obesity complicating pregnancy, second trimester: Secondary | ICD-10-CM | POA: Insufficient documentation

## 2020-07-28 DIAGNOSIS — Z3A27 27 weeks gestation of pregnancy: Secondary | ICD-10-CM | POA: Insufficient documentation

## 2020-07-28 DIAGNOSIS — E669 Obesity, unspecified: Secondary | ICD-10-CM | POA: Diagnosis not present

## 2020-07-28 DIAGNOSIS — D259 Leiomyoma of uterus, unspecified: Secondary | ICD-10-CM | POA: Insufficient documentation

## 2020-07-28 DIAGNOSIS — O10912 Unspecified pre-existing hypertension complicating pregnancy, second trimester: Secondary | ICD-10-CM | POA: Diagnosis not present

## 2020-07-28 DIAGNOSIS — O3412 Maternal care for benign tumor of corpus uteri, second trimester: Secondary | ICD-10-CM | POA: Insufficient documentation

## 2020-07-28 IMAGING — US US MFM OB DETAIL+14 WK
1 series · 13 of 28 positions shown · non-contrast
Comparison: none

[Series 1: us mfm ob detail+14 wk · 13 of 98 slices shown]
[im 4/98]
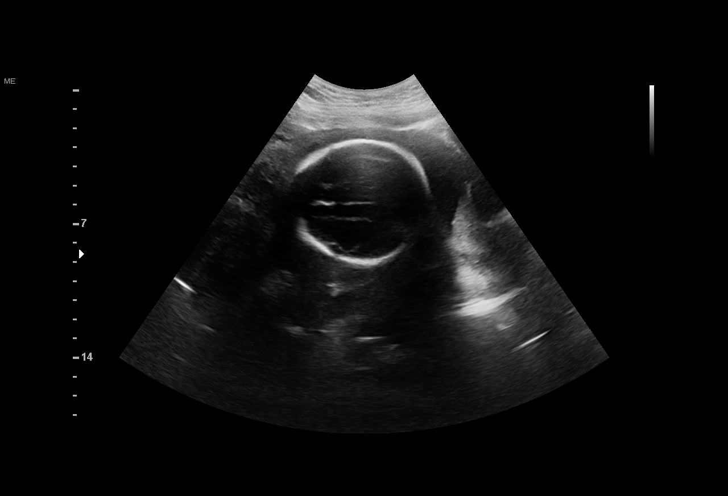
[im 11/98]
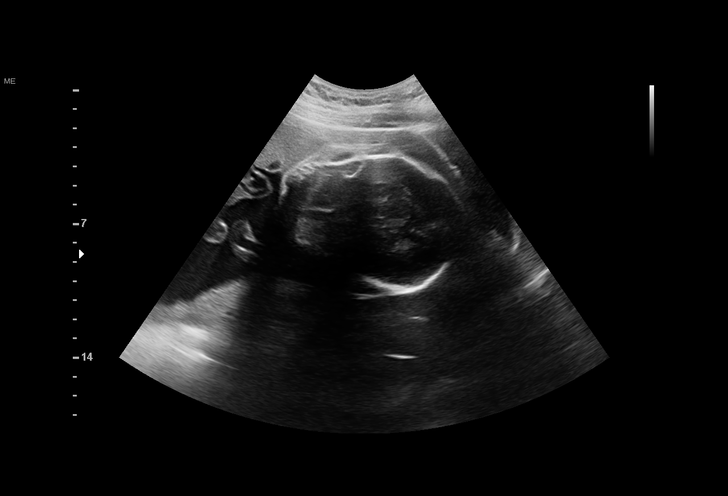
[im 18/98]
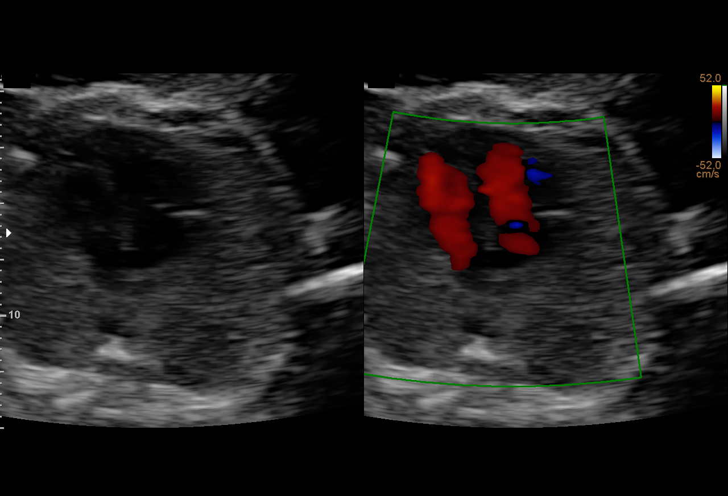
[im 26/98]
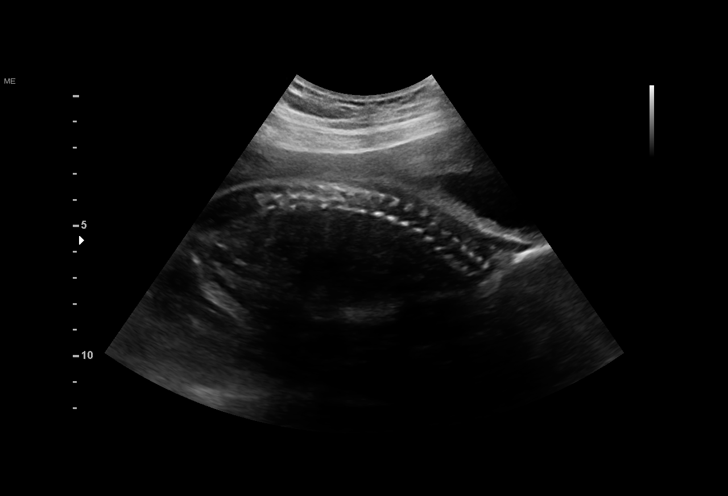
[im 33/98]
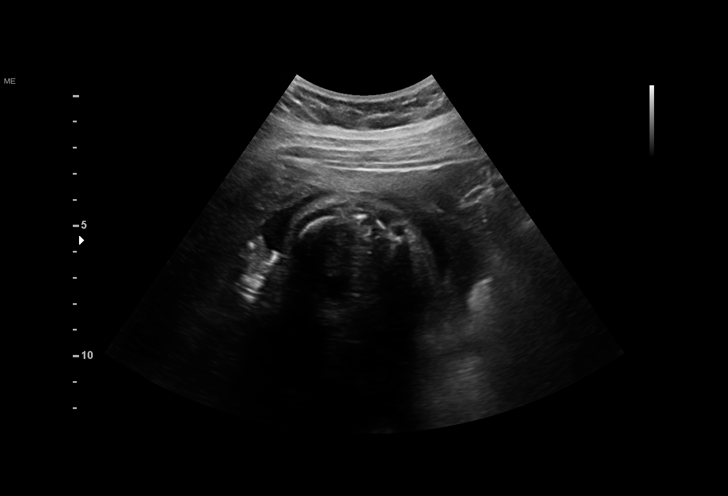
[im 40/98]
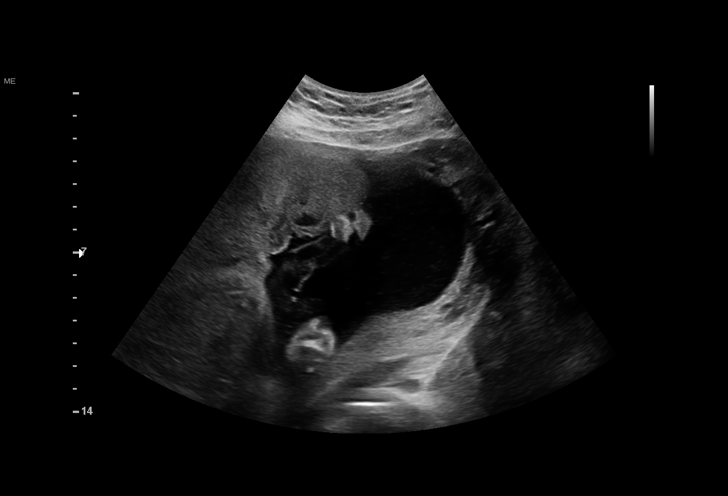
[im 51/98]
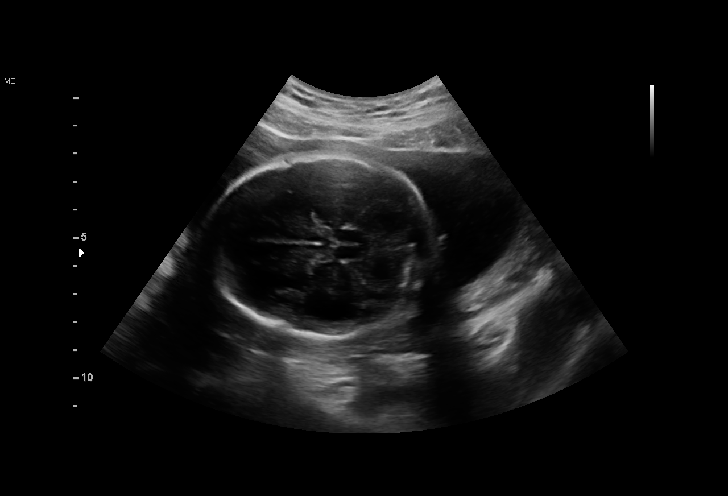
[im 58/98]
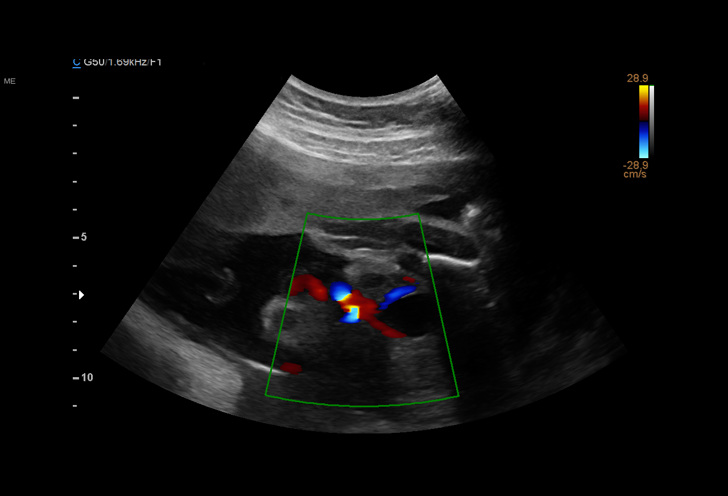
[im 65/98]
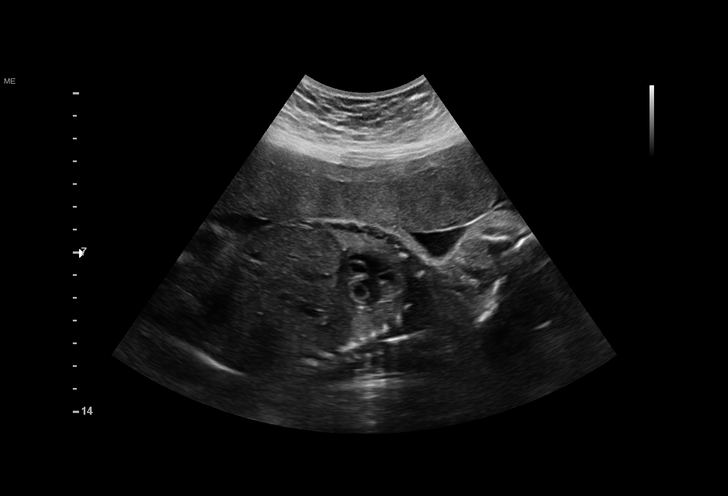
[im 72/98]
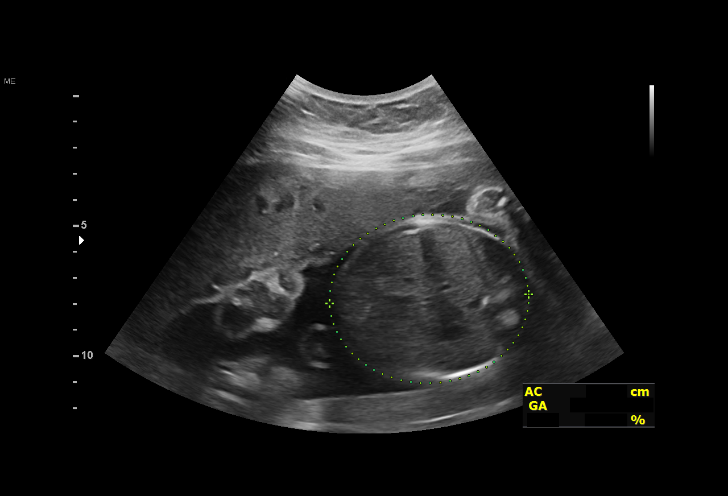
[im 80/98]
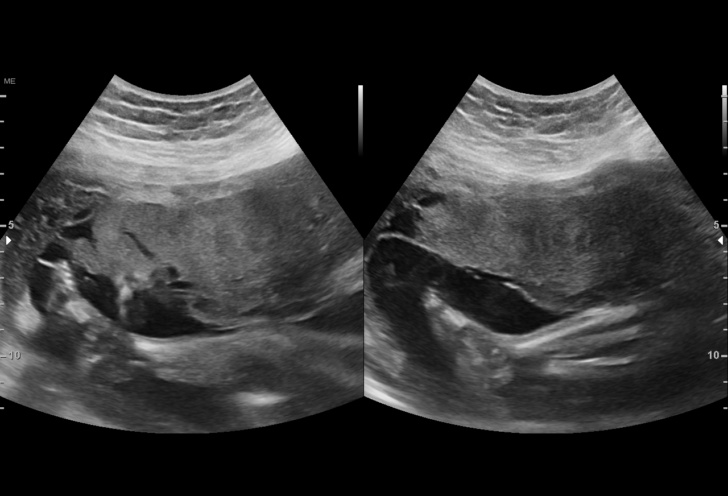
[im 87/98]
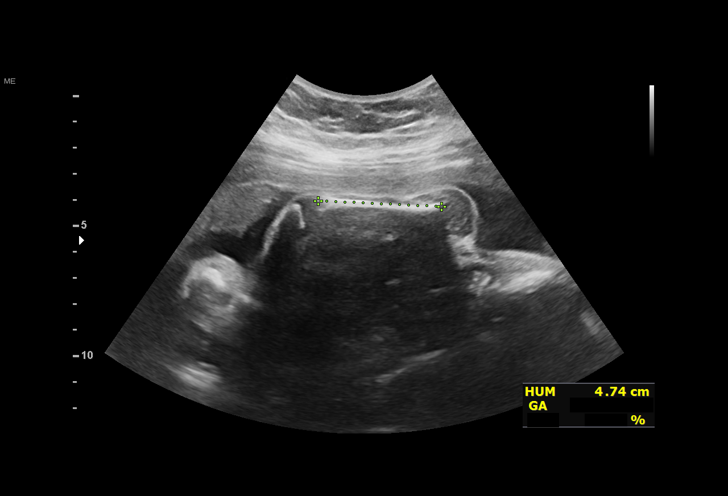
[im 94/98]
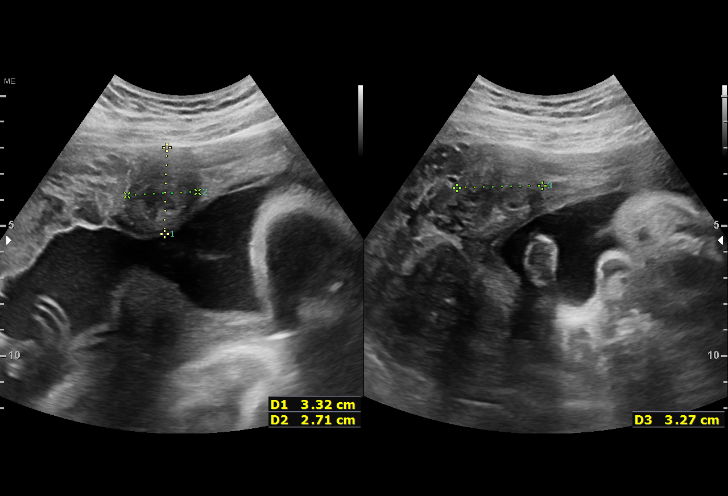

[13 of 28 positions shown; findings below may reference images not displayed]

Indications

 27 weeks gestation of pregnancy
 Obesity complicating pregnancy, second         [T8]
 trimester
 Hypertension - Chronic/Pre-existing            [T8]
 Uterine fibroids                               [T8]
Fetal Evaluation

 Num Of Fetuses:         1
 Fetal Heart Rate(bpm):  147
 Cardiac Activity:       Observed
 Presentation:           Cephalic
 Placenta:               Anterior
 P. Cord Insertion:      Visualized, central
Biometry

 BPD:      66.2  mm     G. Age:  26w 5d         29  %    CI:        73.52   %    70 - 86
                                                         FL/HC:      20.3   %    18.6 -
 HC:      245.3  mm     G. Age:  26w 4d         14  %    HC/AC:      1.11        1.05 -
 AC:      221.3  mm     G. Age:  26w 4d         28  %    FL/BPD:     75.2   %    71 - 87
 FL:       49.8  mm     G. Age:  26w 6d         30  %    FL/AC:      22.5   %    20 - 24
 HUM:      46.7  mm     G. Age:  27w 4d         58  %
 CER:      31.8  mm     G. Age:  27w 3d         81  %

 LV:        3.4  mm
 CM:        3.4  mm

 Est. FW:     967  gm      2 lb 2 oz     26  %
Gestational Age
 LMP:           23w 1d        Date:  [DATE]                 EDD:   [DATE]
 U/S Today:     26w 5d                                        EDD:   [DATE]
 Best:          27w 0d     Det. By:  Early Ultrasound         EDD:   [DATE]
                                     ([DATE])
Anatomy

 Cranium:               Appears normal         LVOT:                   Appears normal
 Cavum:                 Appears normal         Aortic Arch:            Appears normal
 Ventricles:            Appears normal         Ductal Arch:            Appears normal
 Choroid Plexus:        Appears normal         Diaphragm:              Appears normal
 Cerebellum:            Appears normal         Stomach:                Appears normal, left
                                                                       sided
 Posterior Fossa:       Appears normal         Abdomen:                Appears normal
 Nuchal Fold:           Not applicable (>20    Abdominal Wall:         Appears nml (cord
                        wks GA)                                        insert, abd wall)
 Face:                  Appears normal         Cord Vessels:           Appears normal (3
                        (orbits and profile)                           vessel cord)
 Lips:                  Appears normal         Kidneys:                Appear normal
 Palate:                Not well visualized    Bladder:                Appears normal
 Thoracic:              Appears normal         Spine:                  Appears normal
 Heart:                 Appears normal         Upper Extremities:      Appears normal
                        (4CH, axis, and
                        situs)
 RVOT:                  Appears normal         Lower Extremities:      Appears normal
Cervix Uterus Adnexa

 Cervix
 Length:           4.28  cm.
Myomas

 Site                     L(cm)      W(cm)      D(cm)       Location
 Anterior rt LUS
 Posteroir Rt Fundal

 Blood Flow                  RI       PI       Comments

Impression

 Ms. VIERNES is here for a follow up growth due to chronic
 hypertension on procardia 60 XL.
 Normal interval growth was observed today with
 measurements consistent with dates.
 There is good fetal movement and amniotic fluid volume.

 Her blood pressure was 132/87 mmHg. She had low risk
 NIPS and AFP. Her CBC, UPC and CMP are normal.

 Given her chronic hypertension continue serial growth exams
 and intiate weekly testing at 32 weeks.
Recommendations

 Continue serial growth exams every 4-6 weeks.
 Initiate weekly testing at 32 weeks.

## 2020-08-24 ENCOUNTER — Inpatient Hospital Stay
Admission: EM | Admit: 2020-08-24 | Discharge: 2020-08-24 | Disposition: A | Discharge status: Home / Self Care | Payer: Commercial Managed Care - HMO | Attending: Obstetrics and Gynecology | Admitting: Obstetrics and Gynecology

## 2020-08-24 ENCOUNTER — Other Ambulatory Visit: Payer: Self-pay

## 2020-08-24 DIAGNOSIS — O99213 Obesity complicating pregnancy, third trimester: Secondary | ICD-10-CM

## 2020-08-24 DIAGNOSIS — D259 Leiomyoma of uterus, unspecified: Secondary | ICD-10-CM

## 2020-08-24 DIAGNOSIS — N898 Other specified noninflammatory disorders of vagina: Secondary | ICD-10-CM | POA: Diagnosis not present

## 2020-08-24 DIAGNOSIS — R1013 Epigastric pain: Secondary | ICD-10-CM | POA: Diagnosis not present

## 2020-08-24 DIAGNOSIS — O10013 Pre-existing essential hypertension complicating pregnancy, third trimester: Secondary | ICD-10-CM | POA: Diagnosis not present

## 2020-08-24 DIAGNOSIS — Z79899 Other long term (current) drug therapy: Secondary | ICD-10-CM | POA: Insufficient documentation

## 2020-08-24 DIAGNOSIS — O26893 Other specified pregnancy related conditions, third trimester: Secondary | ICD-10-CM | POA: Diagnosis present

## 2020-08-24 DIAGNOSIS — O23593 Infection of other part of genital tract in pregnancy, third trimester: Secondary | ICD-10-CM | POA: Insufficient documentation

## 2020-08-24 DIAGNOSIS — O10913 Unspecified pre-existing hypertension complicating pregnancy, third trimester: Secondary | ICD-10-CM

## 2020-08-24 DIAGNOSIS — R1011 Right upper quadrant pain: Secondary | ICD-10-CM | POA: Diagnosis not present

## 2020-08-24 DIAGNOSIS — Z3A30 30 weeks gestation of pregnancy: Secondary | ICD-10-CM | POA: Diagnosis not present

## 2020-08-24 LAB — WET PREP, GENITAL
Clue Cells Wet Prep HPF POC: NONE SEEN
Sperm: NONE SEEN

## 2020-08-24 LAB — URINALYSIS, COMPLETE (UACMP) WITH MICROSCOPIC
Bilirubin Urine: NEGATIVE
Glucose, UA: NEGATIVE mg/dL
Hgb urine dipstick: NEGATIVE
Ketones, ur: NEGATIVE mg/dL
Nitrite: NEGATIVE
Protein, ur: NEGATIVE mg/dL
Specific Gravity, Urine: 1.005 (ref 1.005–1.030)
pH: 6 (ref 5.0–8.0)

## 2020-08-24 LAB — CHLAMYDIA/NGC RT PCR (ARMC ONLY)
Chlamydia Tr: NOT DETECTED
N gonorrhoeae: NOT DETECTED

## 2020-08-24 MED ORDER — ONDANSETRON 4 MG PO TBDP
4.0000 mg | ORAL_TABLET | Freq: Four times a day (QID) | ORAL | Status: DC | PRN
  Administered 2020-08-24: 4 mg via ORAL
  Filled 2020-08-24: qty 1

## 2020-08-24 MED ORDER — METRONIDAZOLE 500 MG PO TABS
2000.0000 mg | ORAL_TABLET | Freq: Once | ORAL | Status: AC
  Administered 2020-08-24: 2000 mg via ORAL
  Filled 2020-08-24: qty 4

## 2020-08-24 MED ORDER — DOCUSATE SODIUM 100 MG PO CAPS
200.0000 mg | ORAL_CAPSULE | Freq: Two times a day (BID) | ORAL | Status: DC
  Administered 2020-08-24: 200 mg via ORAL
  Filled 2020-08-24: qty 2

## 2020-08-24 MED ORDER — FLUCONAZOLE 50 MG PO TABS
150.0000 mg | ORAL_TABLET | Freq: Once | ORAL | Status: AC
  Administered 2020-08-24: 150 mg via ORAL
  Filled 2020-08-24: qty 1

## 2020-08-24 NOTE — Discharge Summary (Signed)
Terry Ramos is a 35 y.o. female. She is at [redacted]w[redacted]d gestation. Patient's last menstrual period was 02/17/2019. Estimated Date of Delivery: 10/27/20  Prenatal care site: [facility]  Current pregnancy complicated by:  1. Chronic HTN(PO meds)-Procardia 30 mg XL twice daily  2. Possible MS- in process of neuro workup.  3.  Uterine Fibroids: rt post 3.2cm, rt pos 4.9cm, rt ant 3.6cm 4. Social Concerns: mental disability, pts mother is needed for most decision making, FOB involved with pregnancy, planning co-parenting.   Chief complaint: Abdominal pain, straining with BM, some bleeding when wiping after hard BM. Had stopped taking her Miralax a few weeks ago, restarted today.    S: Resting comfortably. no CTX, no VB.no LOF,  Active fetal movement. Denies: HA, visual changes, SOB, or RUQ/epigastric pain  Maternal Medical History:   Past Medical History:  Diagnosis Date  . Hypertension   . Multiple sclerosis ([place])     Past Surgical History:  Procedure Laterality Date  . ABDOMINAL SURGERY      Allergies  Allergen Reactions  . Penicillins     Did it involve swelling of the face/tongue/throat, SOB, or low BP? No Did it involve sudden or severe rash/hives, skin peeling, or any reaction on the inside of your mouth or nose? Yes Did you need to seek medical attention at a hospital or doctor's office? Unknown When did it last happen? If all above answers are "NO", may proceed with cephalosporin use.    Prior to Admission medications   Medication Sig Start Date End Date Taking? Authorizing Provider  B Complex-C (B-COMPLEX WITH VITAMIN C) tablet Take 1 tablet by mouth daily.    [provider]  cholecalciferol (VITAMIN D3) 25 MCG (1000 UNIT) tablet Take 1,000 Units by mouth daily.    [provider]  NIFEdipine (PROCARDIA-XL/NIFEDICAL-XL) 30 MG 24 hr tablet Take 30 mg by mouth daily.    [provider]  Prenatal Vit-Fe Fumarate-FA (PRENATAL  MULTIVITAMIN) TABS tablet Take 1 tablet by mouth daily at 12 noon.    [provider]  vitamin B-12 (CYANOCOBALAMIN) 1000 MCG tablet Take 5,000 mcg by mouth daily.    [provider]      Social History: She  reports that she has never smoked. She has never used smokeless tobacco. She reports that she does not drink alcohol and does not use drugs.  Family History: family history includes Alopecia in her father; Colitis in her maternal aunt; Diabetes in her maternal grandmother, maternal uncle, and paternal grandmother; Heart disease in her paternal grandmother; Hyperlipidemia in her maternal aunt, maternal uncle, and mother; Hypertension in her maternal aunt, maternal aunt, and mother; Irritable bowel syndrome in her sister; Kidney disease in her maternal grandmother; Multiple sclerosis in her maternal grandmother; Osteoarthritis in her mother; Prostate cancer in her maternal grandfather; Schizophrenia in her maternal aunt; Sickle cell anemia in her maternal aunt; Stomach cancer in her paternal grandfather.   Review of Systems: A full review of systems was performed and negative except as noted in the HPI.     O:  LMP 02/17/2019  Results for orders placed or performed during the hospital encounter of 08/24/20 (from the past 48 hour(s))  Urinalysis, Complete w Microscopic Urine, Clean Catch   Collection Time: 08/24/20  6:39 PM  Result Value Ref Range   Color, Urine STRAW (A) YELLOW   APPearance HAZY (A) CLEAR   Specific Gravity, Urine 1.005 1.005 - 1.030   pH 6.0 5.0 - 8.0  Glucose, UA NEGATIVE NEGATIVE mg/dL   Hgb urine dipstick NEGATIVE NEGATIVE   Bilirubin Urine NEGATIVE NEGATIVE   Ketones, ur NEGATIVE NEGATIVE mg/dL   Protein, ur NEGATIVE NEGATIVE mg/dL   Nitrite NEGATIVE NEGATIVE   Leukocytes,Ua LARGE (A) NEGATIVE   RBC / HPF 0-5 0 - 5 RBC/hpf   WBC, UA 11-20 0 - 5 WBC/hpf   Bacteria, UA FEW (A) NONE SEEN   Squamous Epithelial / LPF 0-5 0 - 5   Mucus PRESENT    Wet prep, genital   Collection Time: 08/24/20  7:27 PM  Result Value Ref Range   Yeast Wet Prep HPF POC PRESENT (A) NONE SEEN   Trich, Wet Prep PRESENT (A) NONE SEEN   Clue Cells Wet Prep HPF POC NONE SEEN NONE SEEN   WBC, Wet Prep HPF POC MANY (A) NONE SEEN   Sperm NONE SEEN      Constitutional: NAD, AAOx3  HE/ENT: extraocular movements grossly intact, moist mucous membranes CV: RRR PULM: nl respiratory effort, CTABL     Abd: gravid, non-tender, non-distended, soft      Ext: Non-tender, Nonedematous   Psych: mood appropriate, speech normal Pelvic: SSE done: erythematous vaginal walls, cervix visually long and closed. Mod amt yellow thick DC noted.   Fetal  monitoring: Cat I Appropriate for GA Baseline: 140 bpm Variability: moderate Accelerations: present x >2 Decelerations absent Time 76mins    A/P: 35 y.o. [redacted]w[redacted]d here for antenatal surveillance for abdominal pain and vaginal discharge in pregnancy  Principle Diagnosis:   Trichomonal infection, yeast infection, ligament pain, 30wks   Labor: not present.   Fetal Wellbeing: Reassuring Cat 1 tracing, Reactive NST  Wet prep c/w Trich and yeast: Zofran given then Flagyl 2gm PO x 1, diflucan 150mg  PO x 1.   Reviewed comfort measures, stretching, hydration.   Discussed safe sex.   D/c home stable, precautions reviewed, follow-up as scheduled.    Francetta Found, CNM 08/24/2020  8:57 PM

## 2020-08-26 LAB — URINE CULTURE

## 2020-09-01 ENCOUNTER — Ambulatory Visit: Payer: Commercial Managed Care - HMO | Attending: Maternal & Fetal Medicine

## 2020-09-01 ENCOUNTER — Other Ambulatory Visit: Payer: Self-pay

## 2020-09-01 DIAGNOSIS — E669 Obesity, unspecified: Secondary | ICD-10-CM

## 2020-09-01 DIAGNOSIS — O10913 Unspecified pre-existing hypertension complicating pregnancy, third trimester: Secondary | ICD-10-CM

## 2020-09-01 DIAGNOSIS — O99213 Obesity complicating pregnancy, third trimester: Secondary | ICD-10-CM | POA: Diagnosis present

## 2020-09-01 DIAGNOSIS — O3413 Maternal care for benign tumor of corpus uteri, third trimester: Secondary | ICD-10-CM | POA: Diagnosis not present

## 2020-09-01 DIAGNOSIS — Z3A32 32 weeks gestation of pregnancy: Secondary | ICD-10-CM | POA: Insufficient documentation

## 2020-09-01 DIAGNOSIS — D259 Leiomyoma of uterus, unspecified: Secondary | ICD-10-CM | POA: Insufficient documentation

## 2020-09-01 DIAGNOSIS — O10013 Pre-existing essential hypertension complicating pregnancy, third trimester: Secondary | ICD-10-CM | POA: Diagnosis not present

## 2020-09-01 IMAGING — US US MFM OB FOLLOW-UP
1 series · 14 of 28 positions shown · non-contrast
Comparison: none

[Series 1: us mfm ob follow-up · 56 acquisitions, 14 frames shown]
[im 3/56]
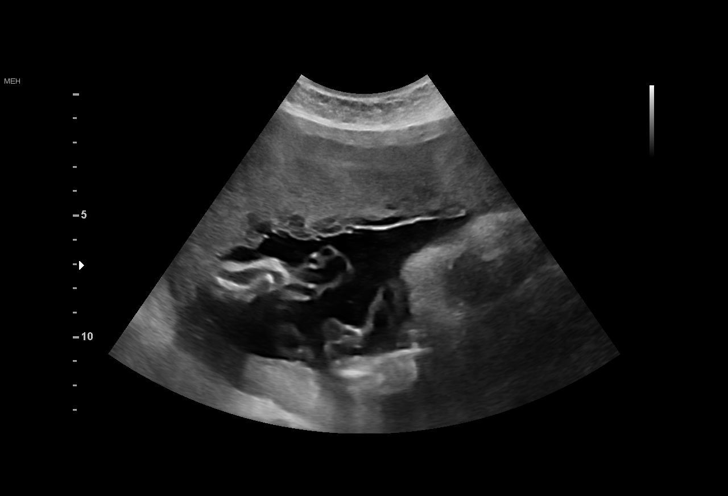
[im 7/56]
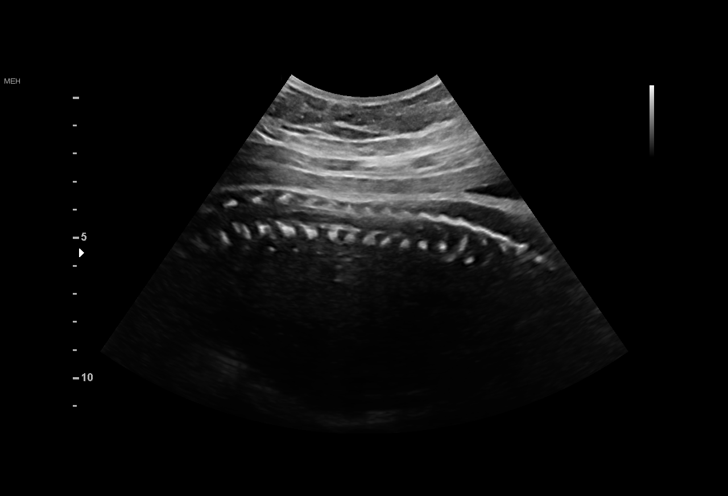
[im 11/56]
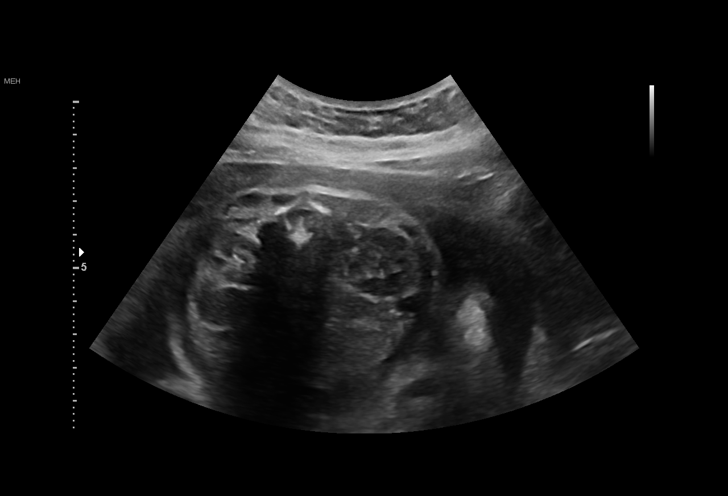
[im 15/56]
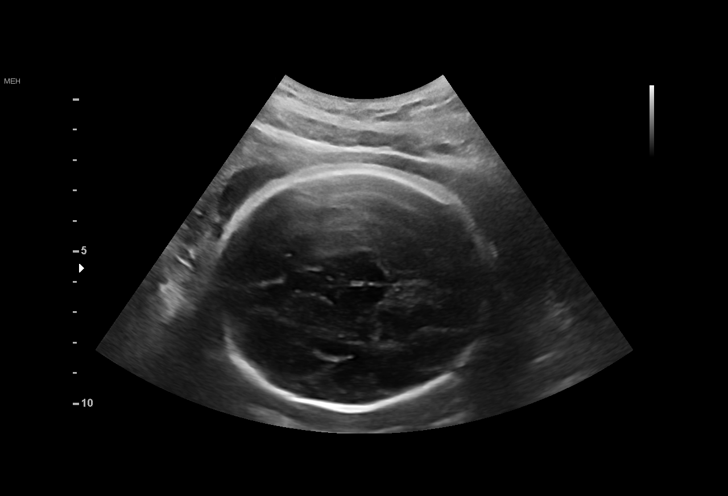
[im 19/56]
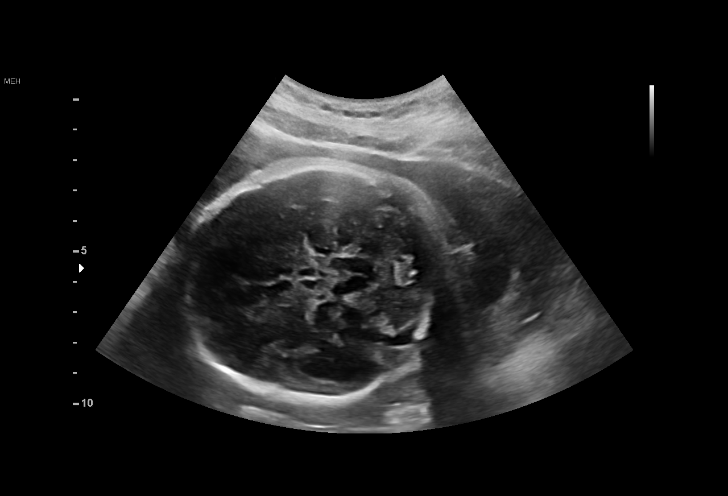
[im 23/56]
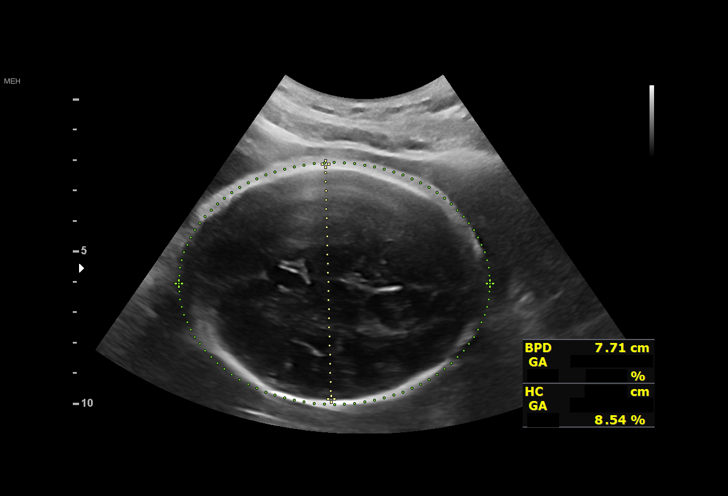
[im 27/56]
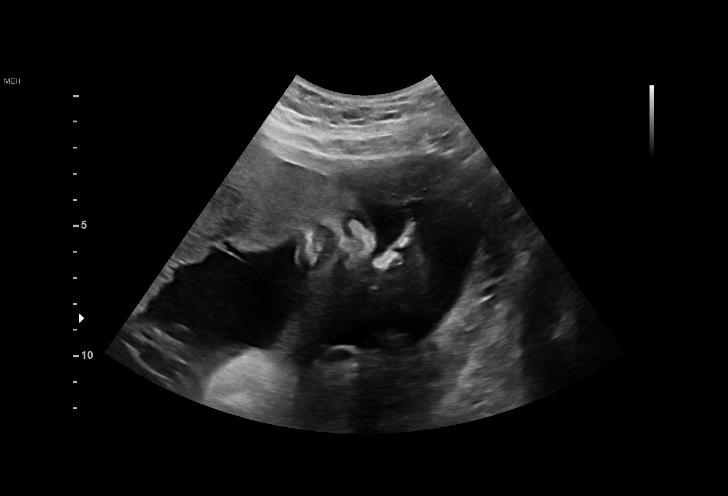
[im 31/56]
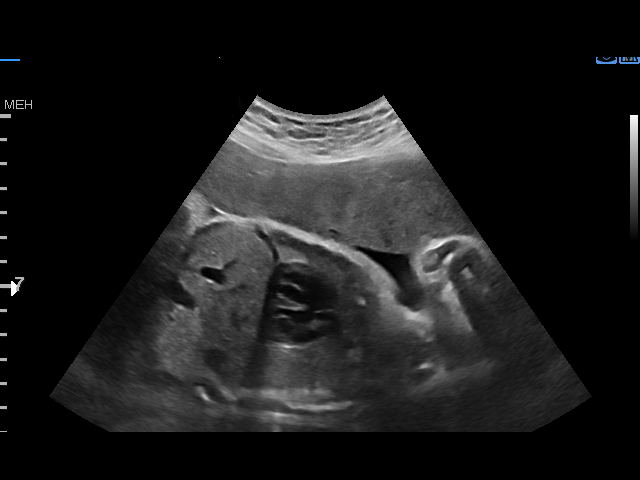
[im 35/56]
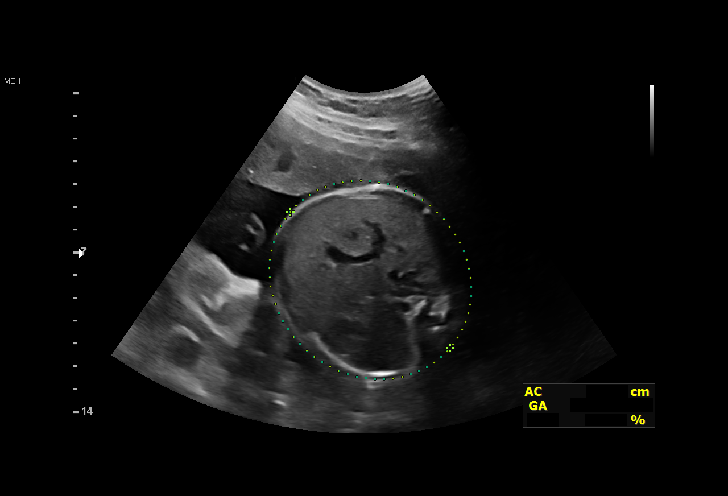
[im 39/56]
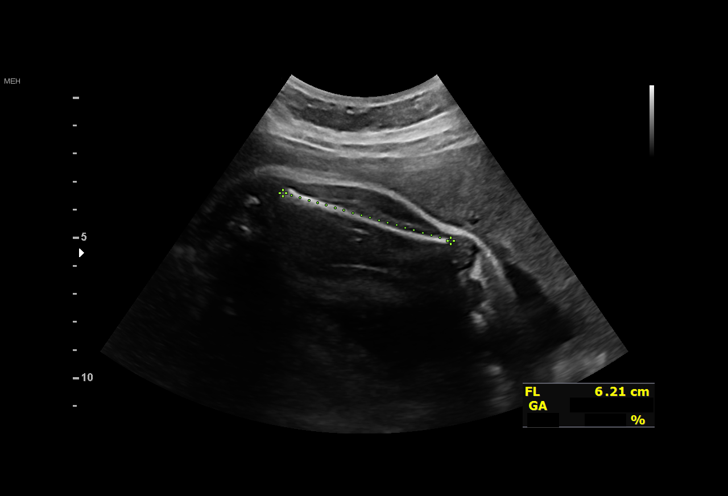
[im 43/56]
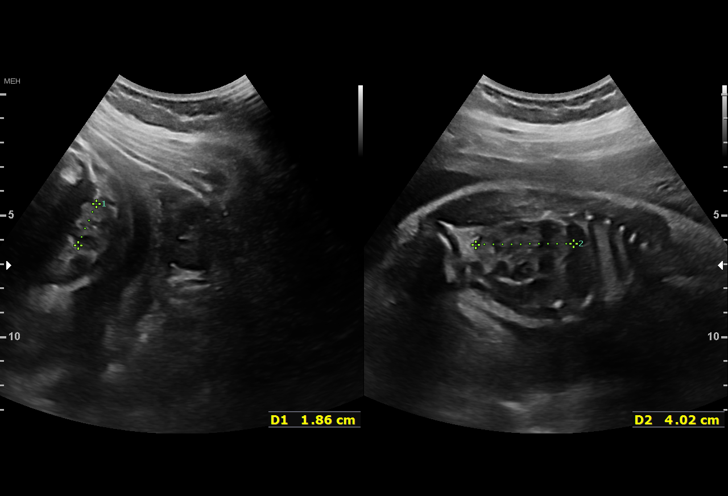
[im 47/56]
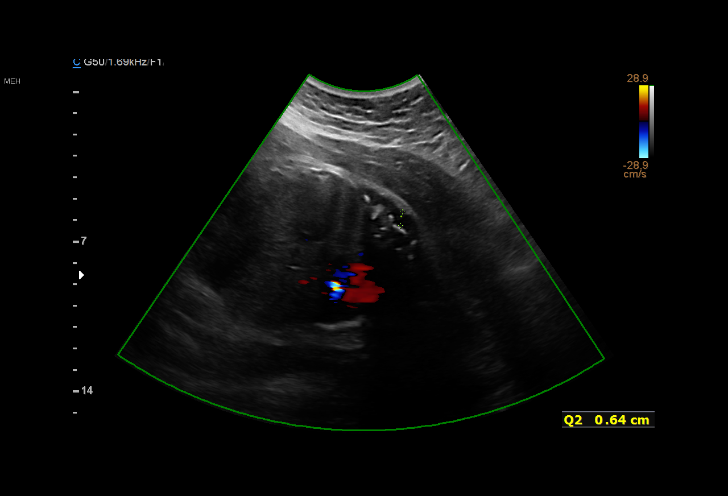
[im 51/56]
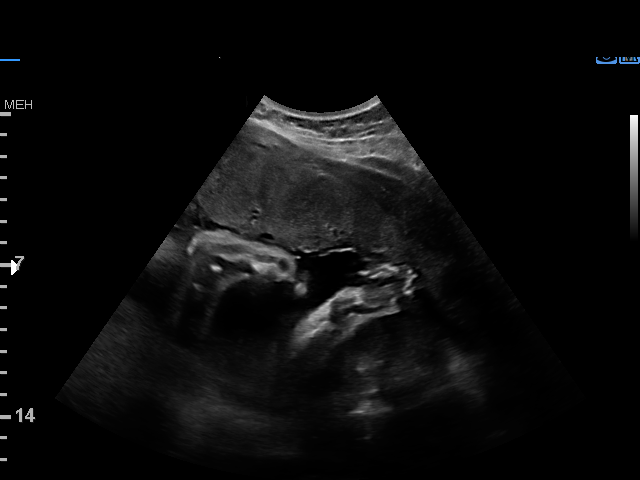
[im 56/56]
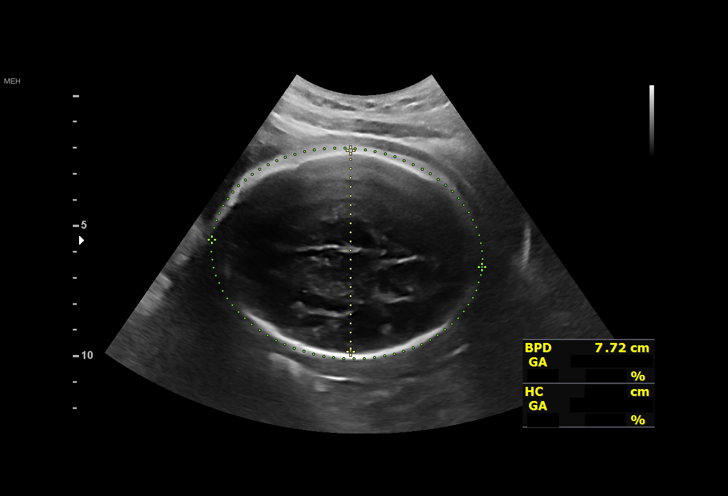

[14 of 28 positions shown; findings below may reference images not displayed]

Indications

 32 weeks gestation of pregnancy
 Obesity complicating pregnancy, third          [VB]
 trimester
 Pre-existing essential hypertension            [VB]
 complicating pregnancy, third trimester
Fetal Evaluation

 Num Of Fetuses:         1
 Fetal Heart Rate(bpm):  143
 Cardiac Activity:       Observed
 Presentation:           Cephalic
 Placenta:               Anterior

 Amniotic Fluid
 AFI FV:      Within normal limits

 AFI Sum(cm)     %Tile
 12.7            37
Biophysical Evaluation

 Amniotic F.V:   Within normal limits       F. Tone:        Observed
 F. Movement:    Observed                   Score:          [DATE]
 F. Breathing:   Observed
Biometry
 BPD:      77.2  mm     G. Age:  31w 0d         14  %    CI:        71.85   %    70 - 86
                                                         FL/HC:      21.6   %    19.1 -
 HC:      289.9  mm     G. Age:  31w 6d         14  %    HC/AC:      1.04        0.96 -
 AC:      279.7  mm     G. Age:  32w 0d         49  %    FL/BPD:     81.0   %    71 - 87
 FL:       62.5  mm     G. Age:  32w 3d         47  %    FL/AC:      22.3   %    20 - 24
 HUM:      54.8  mm     G. Age:  31w 6d         48  %

 Est. FW:    [VB]  gm      4 lb 3 oz     39  %
Gestational Age

 LMP:           28w 1d        Date:  [DATE]                 EDD:   [DATE]
 U/S Today:     31w 6d                                        EDD:   [DATE]
 Best:          32w 0d     Det. By:  Early Ultrasound         EDD:   [DATE]
                                     ([DATE])
Anatomy

 Cranium:               Appears normal         Aortic Arch:            Previously seen
 Cavum:                 Appears normal         Ductal Arch:            Previously seen
 Ventricles:            Appears normal         Diaphragm:              Appears normal
 Choroid Plexus:        Previously seen        Stomach:                Appears normal, left
                                                                       sided
 Cerebellum:            Previously seen        Abdomen:                Appears normal
 Posterior Fossa:       Previously seen        Abdominal Wall:         Previously seen
 Nuchal Fold:           Previously seen        Cord Vessels:           Previously seen
 Face:                  Profile previously     Kidneys:                Appear normal
                        seen
 Lips:                  Previously seen        Bladder:                Appears normal
 Thoracic:              Previously seen        Spine:                  Appears normal
 Heart:                 Previously seen        Upper Extremities:      Previously seen
 RVOT:                  Previously seen        Lower Extremities:      Previously seen
 LVOT:                  Previously seen
Impression

 Follow up growth due to chronic hypertension and elevated
 BMI.
 Normal interval growth with measurements consistent with
 dates
 Good fetal movement and amniotic fluid volume
 Biophysical profile [DATE]

 Blood pressure is stable today at 120/92 she continues taking
 Procadia 60 mg daily.
Recommendations

 Continue weekly testing
 Repeat growth in 4 weeks.

## 2020-09-02 ENCOUNTER — Other Ambulatory Visit: Payer: Self-pay

## 2020-09-02 DIAGNOSIS — O10913 Unspecified pre-existing hypertension complicating pregnancy, third trimester: Secondary | ICD-10-CM

## 2020-09-02 DIAGNOSIS — O99213 Obesity complicating pregnancy, third trimester: Secondary | ICD-10-CM

## 2020-09-08 ENCOUNTER — Ambulatory Visit (HOSPITAL_BASED_OUTPATIENT_CLINIC_OR_DEPARTMENT_OTHER): Payer: Commercial Managed Care - HMO | Admitting: Obstetrics and Gynecology

## 2020-09-08 ENCOUNTER — Other Ambulatory Visit: Payer: Self-pay

## 2020-09-08 ENCOUNTER — Ambulatory Visit: Payer: Commercial Managed Care - HMO | Attending: Obstetrics and Gynecology

## 2020-09-08 DIAGNOSIS — O10013 Pre-existing essential hypertension complicating pregnancy, third trimester: Secondary | ICD-10-CM

## 2020-09-08 DIAGNOSIS — Z3A33 33 weeks gestation of pregnancy: Secondary | ICD-10-CM | POA: Diagnosis not present

## 2020-09-08 DIAGNOSIS — O10913 Unspecified pre-existing hypertension complicating pregnancy, third trimester: Secondary | ICD-10-CM | POA: Diagnosis present

## 2020-09-08 DIAGNOSIS — O99213 Obesity complicating pregnancy, third trimester: Secondary | ICD-10-CM | POA: Diagnosis not present

## 2020-09-08 DIAGNOSIS — E669 Obesity, unspecified: Secondary | ICD-10-CM | POA: Diagnosis not present

## 2020-09-08 NOTE — Procedures (Signed)
Terry Ramos Jun 07, 1985 [redacted]w[redacted]d  Fetus A Non-Stress Test Interpretation for 09/08/20  Indication: Chronic Hypertenstion   NST Started @ 0945  External Mode  Baseline Rate 140  10x10  Variability 6-25 BPM=Moderate  Decelerations None  Mode Toco  Completed @ 1004  Reactive Nonstress Test Per Dr. Donalee Citrin

## 2020-09-14 ENCOUNTER — Other Ambulatory Visit: Payer: Self-pay

## 2020-09-14 DIAGNOSIS — O99213 Obesity complicating pregnancy, third trimester: Secondary | ICD-10-CM

## 2020-09-14 DIAGNOSIS — O10919 Unspecified pre-existing hypertension complicating pregnancy, unspecified trimester: Secondary | ICD-10-CM

## 2020-09-15 ENCOUNTER — Ambulatory Visit: Payer: Commercial Managed Care - HMO

## 2020-09-18 ENCOUNTER — Other Ambulatory Visit: Payer: Commercial Managed Care - HMO | Attending: Obstetrics and Gynecology

## 2020-09-22 ENCOUNTER — Other Ambulatory Visit: Payer: Commercial Managed Care - HMO

## 2020-09-22 ENCOUNTER — Ambulatory Visit: Payer: Commercial Managed Care - HMO | Attending: Obstetrics

## 2020-09-22 ENCOUNTER — Other Ambulatory Visit: Payer: Self-pay

## 2020-09-22 DIAGNOSIS — O99213 Obesity complicating pregnancy, third trimester: Secondary | ICD-10-CM

## 2020-09-22 DIAGNOSIS — Z3A35 35 weeks gestation of pregnancy: Secondary | ICD-10-CM | POA: Insufficient documentation

## 2020-09-22 DIAGNOSIS — O10013 Pre-existing essential hypertension complicating pregnancy, third trimester: Secondary | ICD-10-CM | POA: Diagnosis present

## 2020-09-22 DIAGNOSIS — O10919 Unspecified pre-existing hypertension complicating pregnancy, unspecified trimester: Secondary | ICD-10-CM

## 2020-09-22 IMAGING — US US MFM FETAL BPP W/O NON-STRESS
2 series · 12 of 18 positions shown · non-contrast
Comparison: none

[Series 1: us mfm fetal bpp w/o non-stress · 11 acquisitions, 7 frames shown (1 of 2)]
[im 1/11]
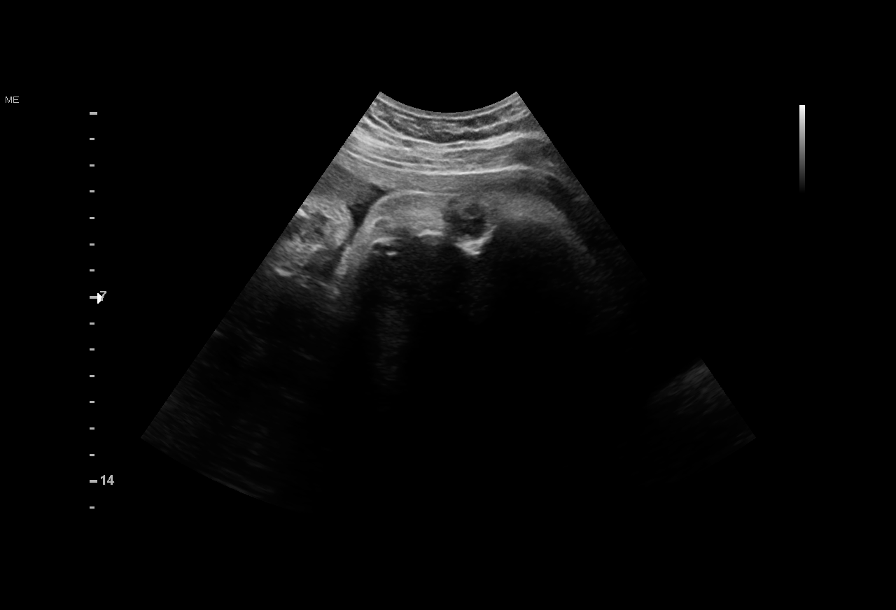
[im 3/11]
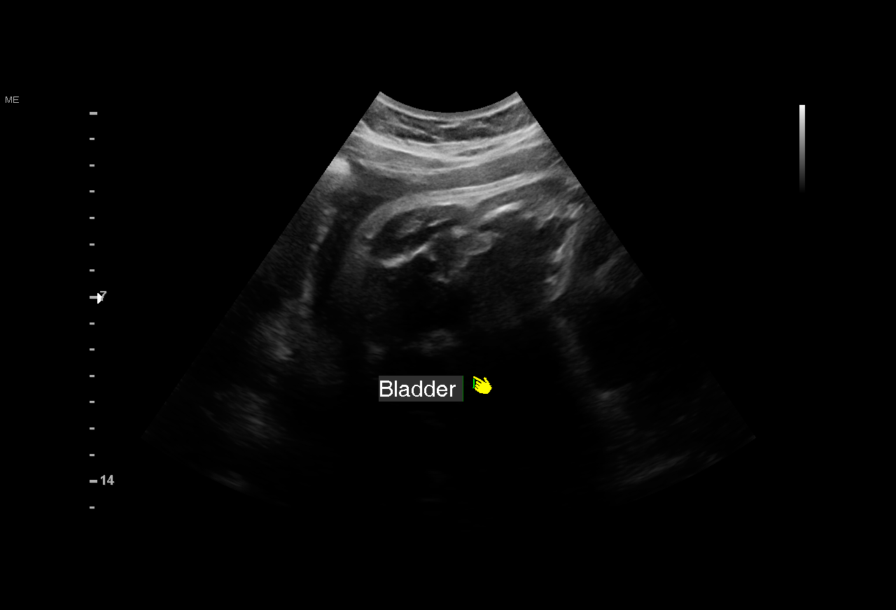
[im 4/11]
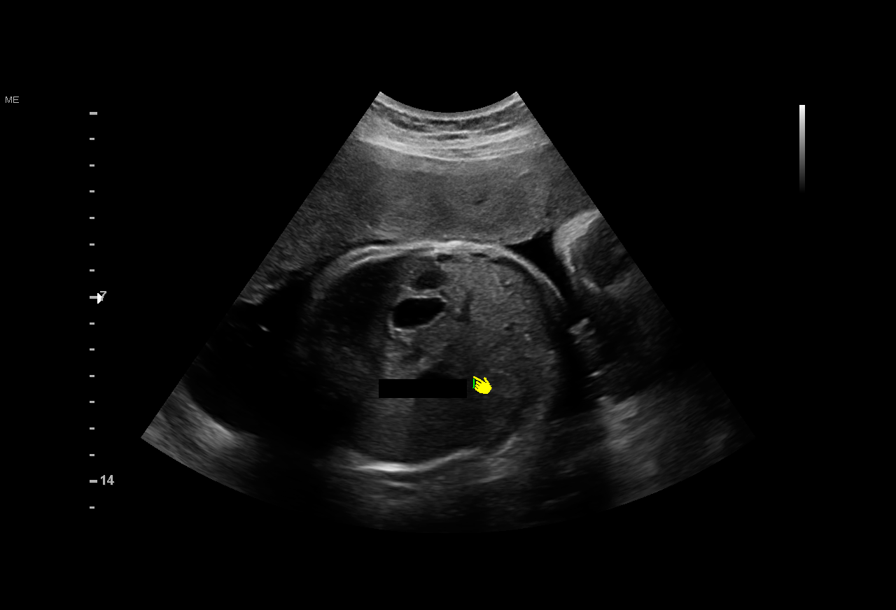
[im 6/11]
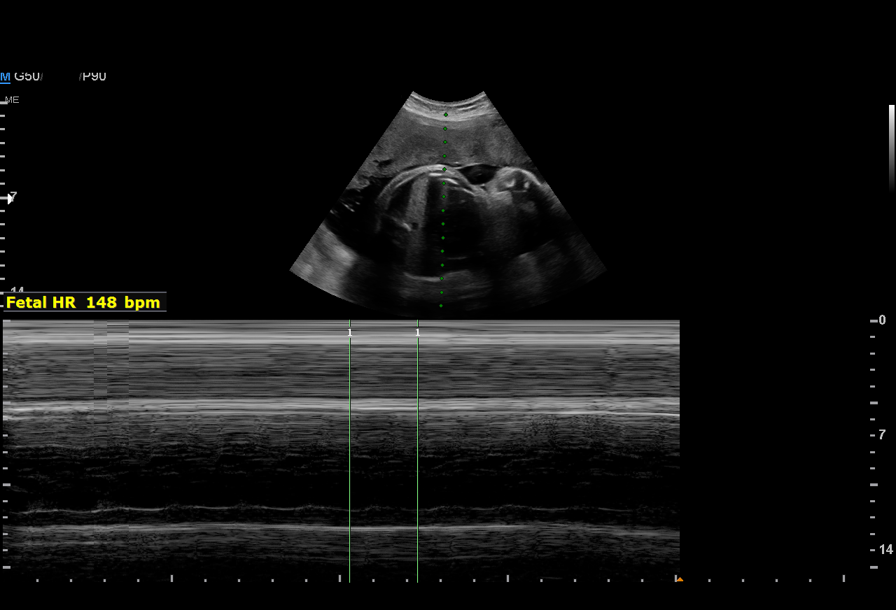
[im 7/11]
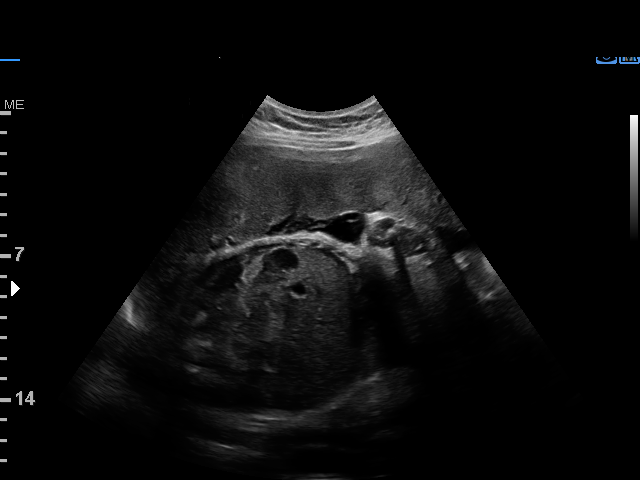
[im 9/11]
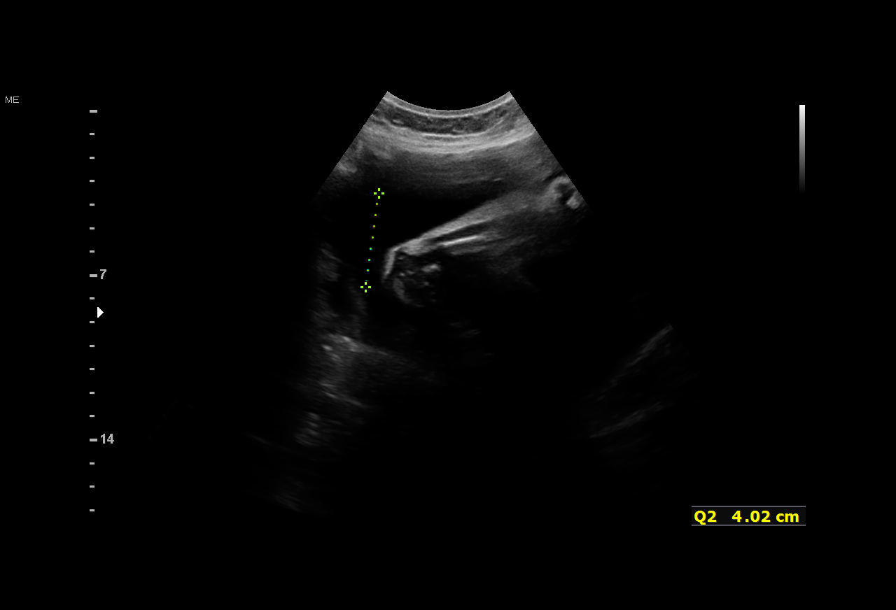
[im 10/11]
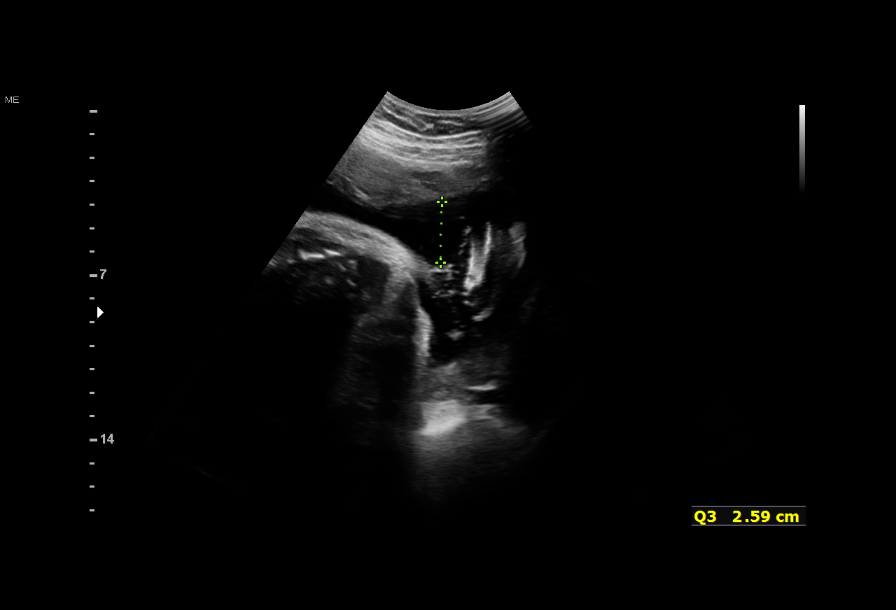

[Series 4: us mfm fetal bpp w/o non-stress · 7 acquisitions, 5 frames shown (2 of 2)]
[im 1/7]
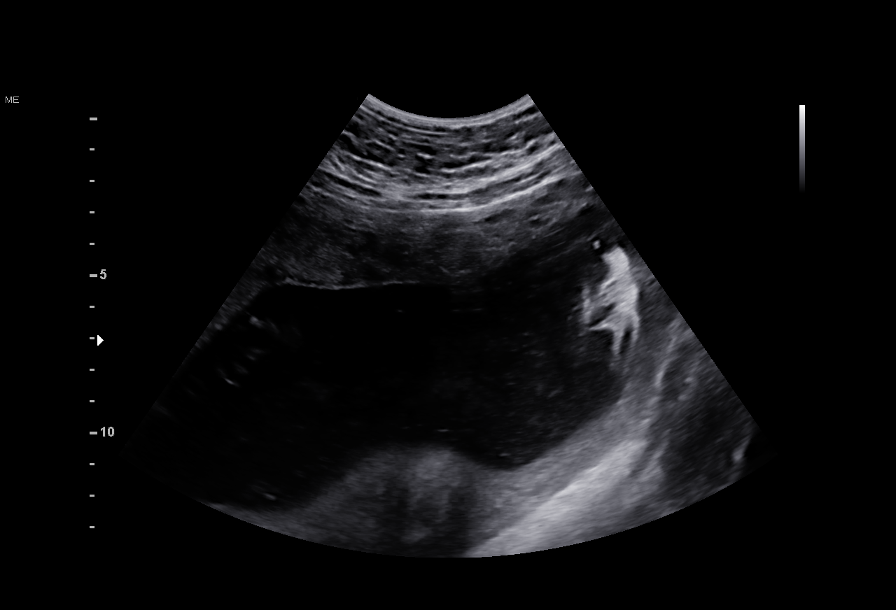
[im 2/7]
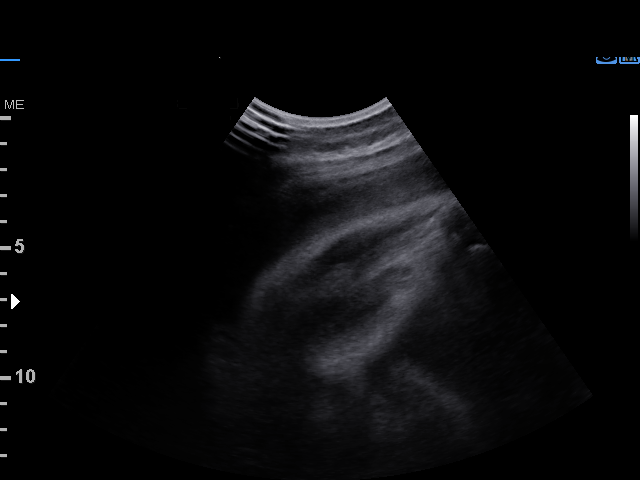
[im 4/7]
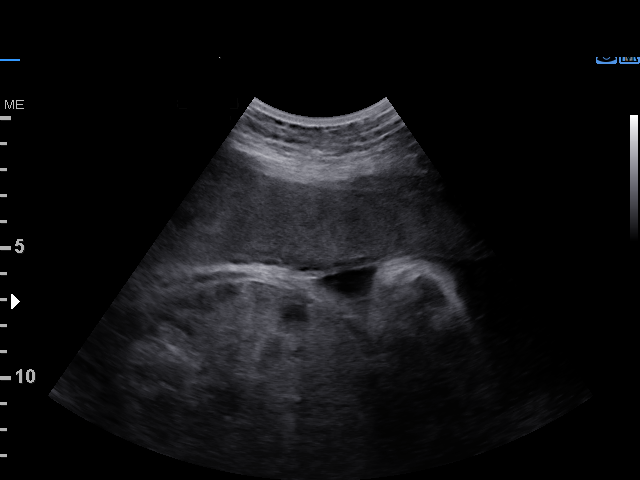
[im 5/7]
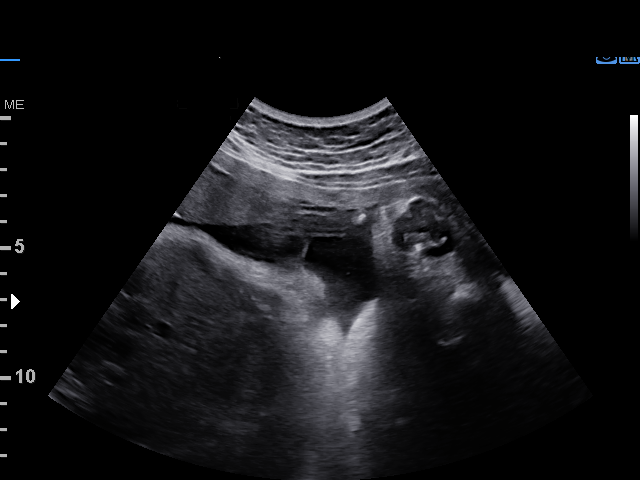
[im 7/7]
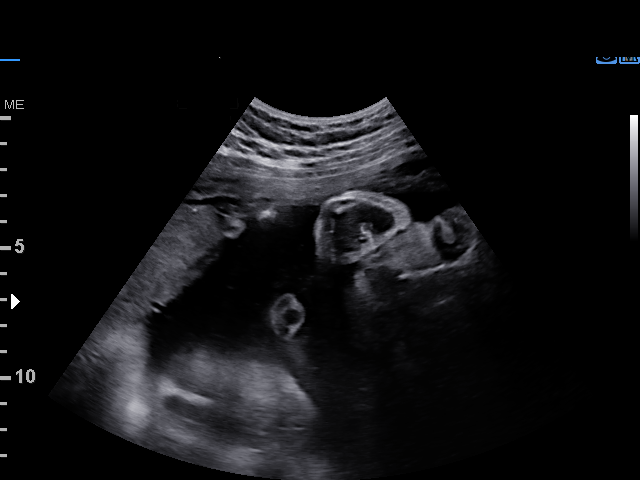

[12 of 18 positions shown; findings below may reference images not displayed]

Indications

 Hypertension - Chronic/Pre-existing            [HB]
 35 weeks gestation of pregnancy
 Obesity complicating pregnancy, third          [HB]
 trimester
Fetal Evaluation

 Num Of Fetuses:         1
 Fetal Heart Rate(bpm):  148
 Cardiac Activity:       Observed
 Presentation:           Cephalic
 Placenta:               Anterior
 P. Cord Insertion:      Previously Visualized

 AFI Sum(cm)     %Tile       Largest Pocket(cm)
 14              50          4

 RUQ(cm)       RLQ(cm)       LUQ(cm)        LLQ(cm)
 3.8           3.6           4
Biophysical Evaluation

 Amniotic F.V:   Within normal limits       F. Tone:        Observed
 F. Movement:    Observed                   Score:          [DATE]
 F. Breathing:   Observed
Gestational Age

 LMP:           31w 1d        Date:  [DATE]                 EDD:   [DATE]
 Best:          35w 0d     Det. By:  Early Ultrasound         EDD:   [DATE]
                                     ([DATE])
Comments

 This patient was seen for a biophysical profile due to chronic
 hypertension currently treated with Procardia 60 mg daily.
 She denies any problems since her last exam and reports
 feeling vigorous fetal movements throughout the day.
 A biophysical profile performed today was [DATE].
 There was normal amniotic fluid noted on today's ultrasound
 exam.
 Due to chronic hypertension, the patient reports that delivery
 is planned at around 37 weeks.  She should continue twice
 weekly fetal testing until delivery.
 The patient will have another nonstress test performed in
 your office later this week.  She will return next week to our
 office for another biophysical profile.

## 2020-09-24 ENCOUNTER — Other Ambulatory Visit: Payer: Self-pay

## 2020-09-24 DIAGNOSIS — O10919 Unspecified pre-existing hypertension complicating pregnancy, unspecified trimester: Secondary | ICD-10-CM

## 2020-09-24 DIAGNOSIS — O99213 Obesity complicating pregnancy, third trimester: Secondary | ICD-10-CM

## 2020-09-28 ENCOUNTER — Other Ambulatory Visit: Payer: Self-pay

## 2020-09-28 DIAGNOSIS — O10913 Unspecified pre-existing hypertension complicating pregnancy, third trimester: Secondary | ICD-10-CM

## 2020-09-28 NOTE — Progress Notes (Signed)
G1P0 with Chronic HTN at [redacted]w[redacted]d, LMP of 02/17/2020, not c/w early [place] at [redacted]w[redacted]d.  Scheduled for induction of labor for Chronic htn in pregnancy complicated by multiple sclerosis and pregnancy on 10/07/2020 at 0500.   [facility]ren[facility] provider: [facility] Pregnancy complicated by: Chronic HTN in pregnancy  Multiple sclerosis - new diagnosis  Obesity in pregnancy  Learning disability  Social concerns with FOB  Uterine fibroids   Prenatal Labs: Blood type/Rh O pos   Antibody screen neg  Rubella Immune  Varicella Immune  RPR NR  HBsAg Neg  HIV NR  GC neg  Chlamydia neg  Genetic screening negative  1 hour GTT 135  3 hour GTT N/A  GBS Pending    Tdap: given 08/13/2020  Flu: given 06/12/2020 Contraception: BTL - consents signed 08/13/2020 Feeding preference: formula   ____ Drinda Butts, CNM Certified Nurse Midwife [facility]

## 2020-09-29 ENCOUNTER — Ambulatory Visit: Payer: Commercial Managed Care - HMO | Attending: Maternal & Fetal Medicine

## 2020-09-29 ENCOUNTER — Other Ambulatory Visit: Payer: Self-pay

## 2020-09-29 DIAGNOSIS — O10919 Unspecified pre-existing hypertension complicating pregnancy, unspecified trimester: Secondary | ICD-10-CM

## 2020-09-29 DIAGNOSIS — O99213 Obesity complicating pregnancy, third trimester: Secondary | ICD-10-CM | POA: Diagnosis not present

## 2020-09-29 DIAGNOSIS — Z3A36 36 weeks gestation of pregnancy: Secondary | ICD-10-CM | POA: Diagnosis not present

## 2020-09-29 DIAGNOSIS — O99353 Diseases of the nervous system complicating pregnancy, third trimester: Secondary | ICD-10-CM

## 2020-09-29 DIAGNOSIS — O10913 Unspecified pre-existing hypertension complicating pregnancy, third trimester: Secondary | ICD-10-CM

## 2020-09-29 DIAGNOSIS — E669 Obesity, unspecified: Secondary | ICD-10-CM

## 2020-09-29 DIAGNOSIS — G35 Multiple sclerosis: Secondary | ICD-10-CM | POA: Diagnosis not present

## 2020-09-29 IMAGING — US US MFM FETAL BPP W/O NON-STRESS
1 series · 15 of 15 positions shown · non-contrast
Comparison: none

[Series 1: us mfm fetal bpp w/o non-stress · 15 acquisitions, 15 frames shown]
[im 1/15]
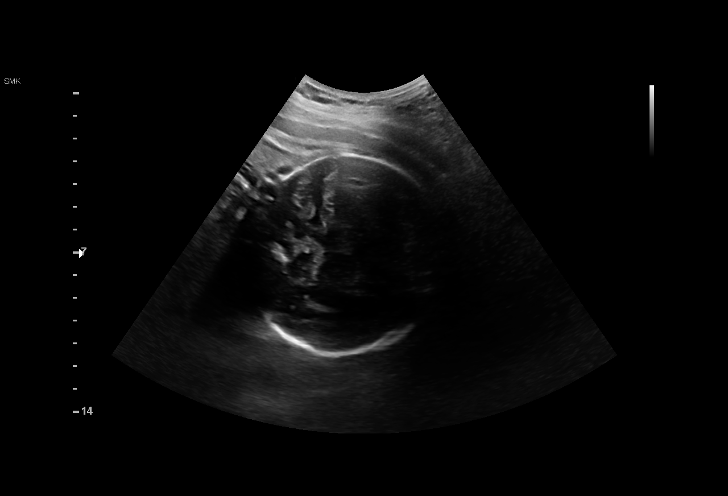
[im 2/15]
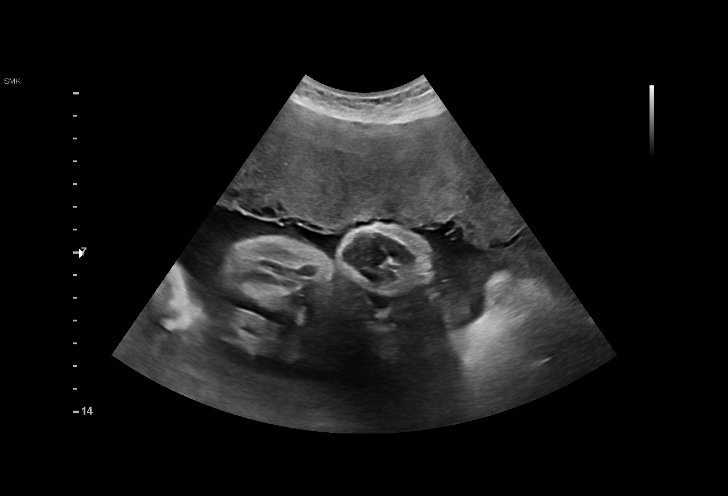
[im 3/15]
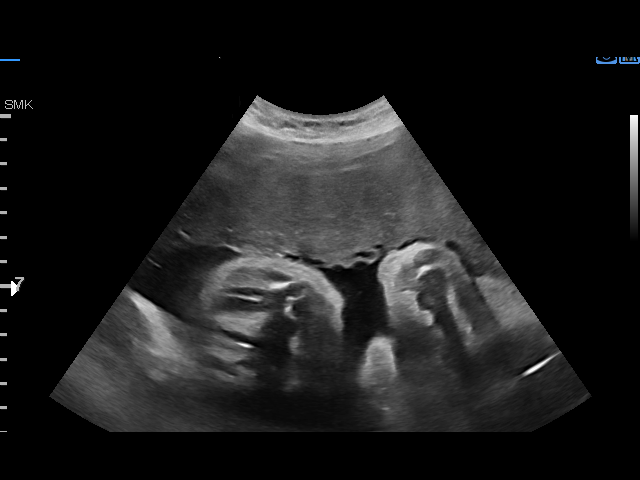
[im 4/15]
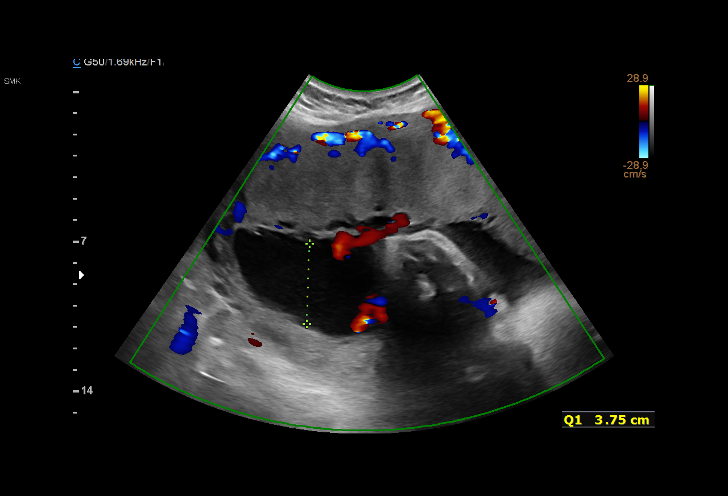
[im 5/15]
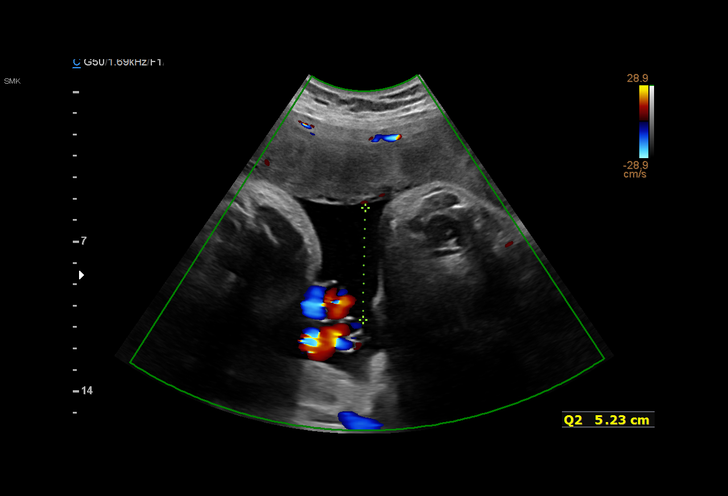
[im 6/15]
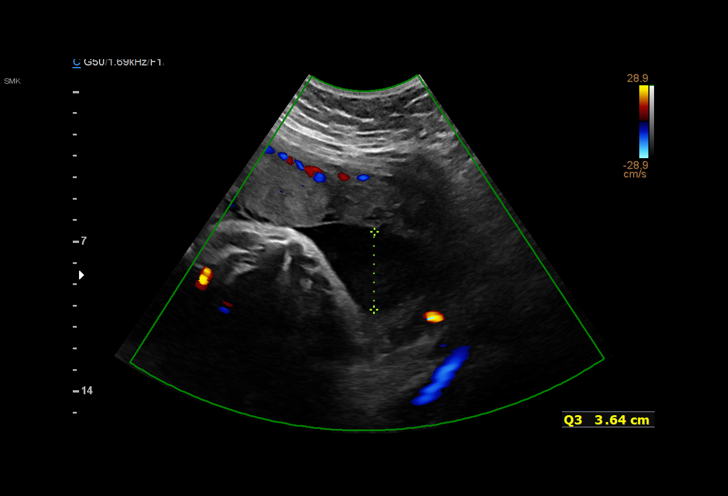
[im 7/15]
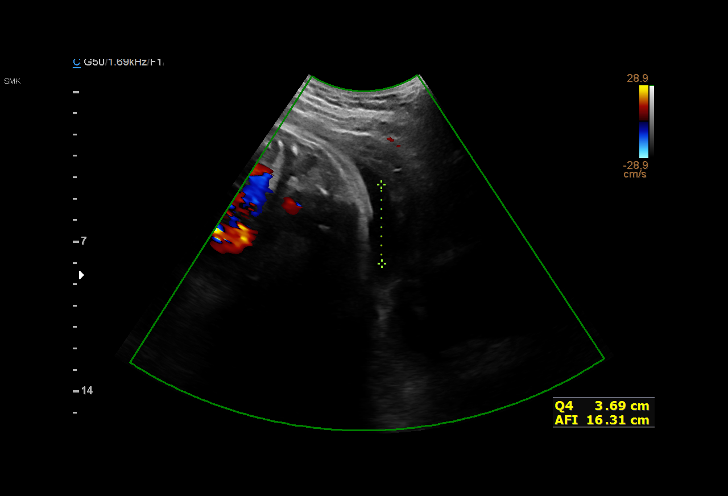
[im 8/15]
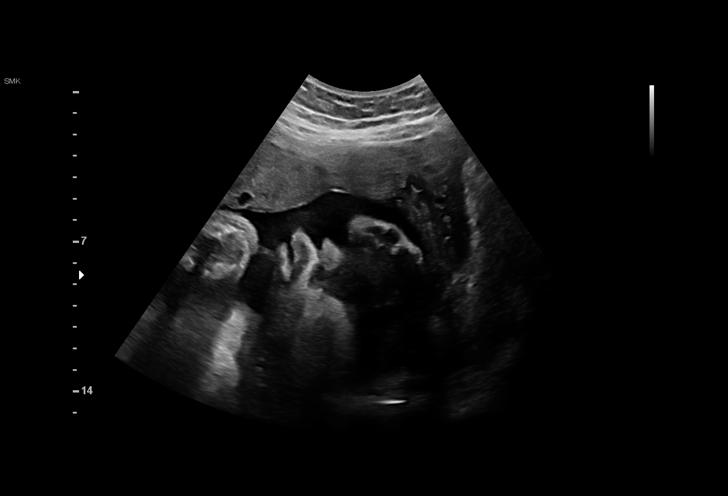
[im 9/15]
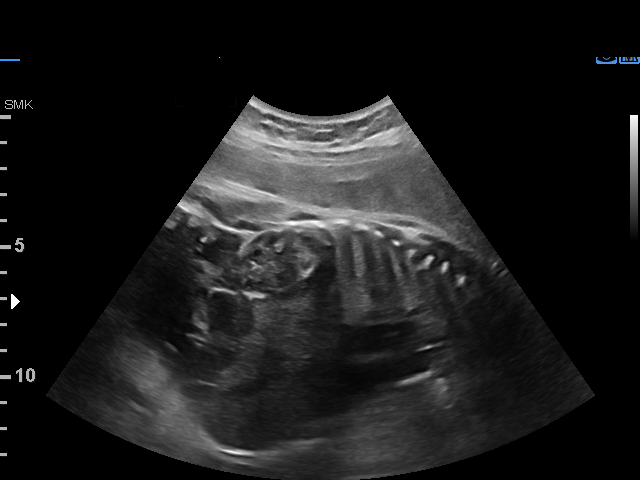
[im 10/15]
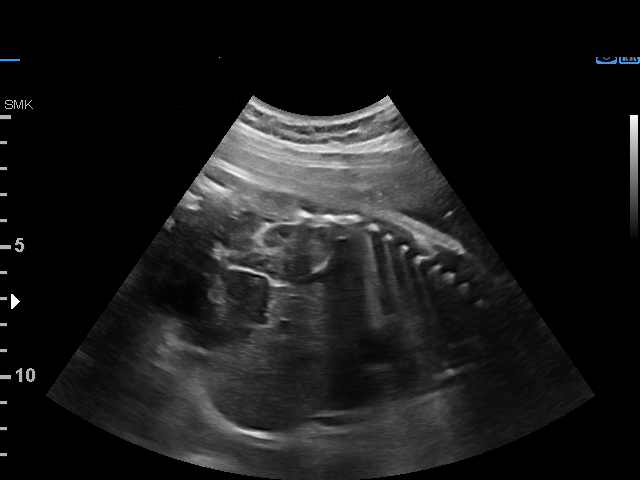
[im 11/15]
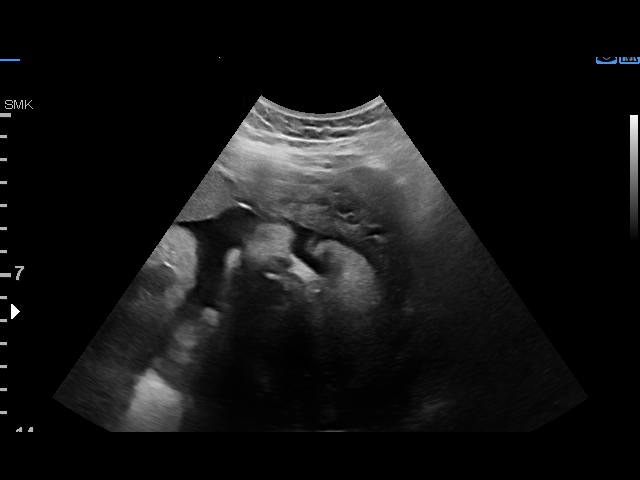
[im 12/15]
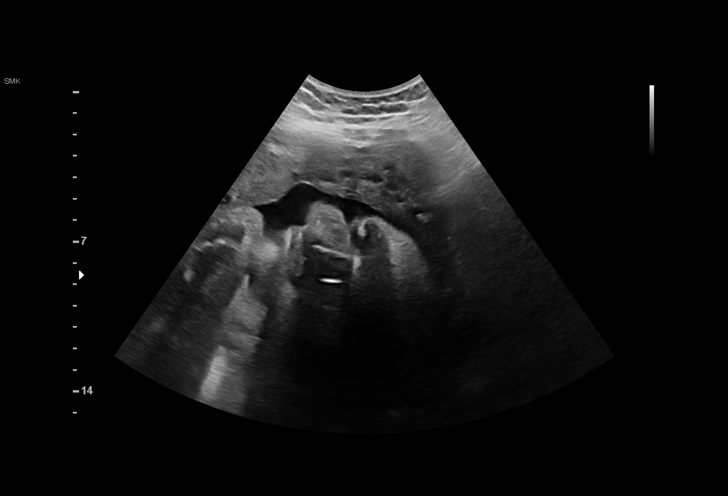
[im 13/15]
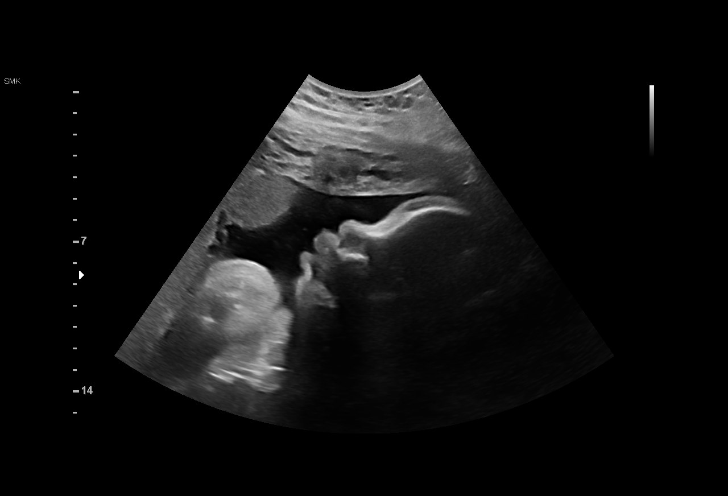
[im 14/15]
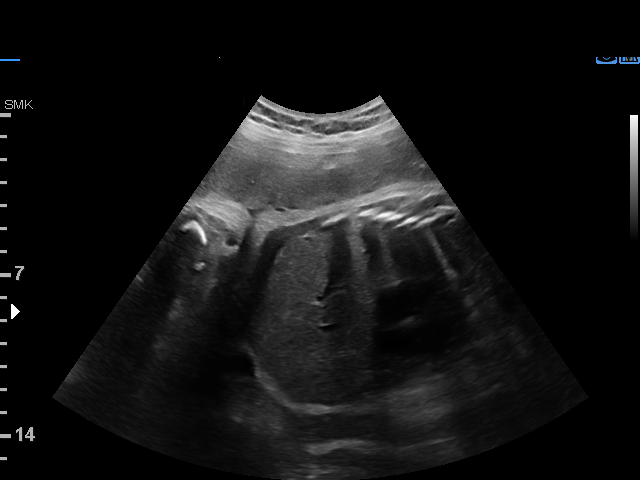
[im 15/15]
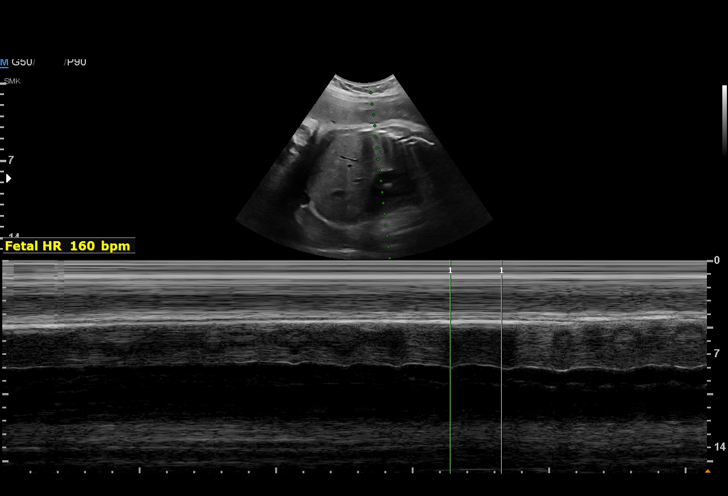

[15 of 15 positions shown; findings below may reference images not displayed]

Indications

 36 weeks gestation of pregnancy
 Unspecified maternal hypertension, third       [0E]
 trimester
 Obesity complicating pregnancy, third          [0E]
 trimester
Fetal Evaluation

 Num Of Fetuses:         1
 Fetal Heart Rate(bpm):  160
 Cardiac Activity:       Observed
 Presentation:           Cephalic
 Placenta:               Anterior

 Amniotic Fluid
 AFI FV:      Within normal limits

 AFI Sum(cm)     %Tile
 16.3            60
Biophysical Evaluation

 Amniotic F.V:   Within normal limits       F. Tone:        Observed
 F. Movement:    Observed                   Score:          [DATE]
 F. Breathing:   Observed
Gestational Age

 LMP:           32w 1d        Date:  [DATE]                 EDD:   [DATE]
 Best:          36w 0d     Det. By:  Early Ultrasound         EDD:   [DATE]
                                     ([DATE])
Impression

 Antenatal testing performed given maternal elevated BMI,
 chronic hypertension, and new diagnosis of MS.
 The biophysical profile was [DATE] with good fetal movement and
 amniotic fluid volume.

 Blood pressure is 135/91 mmHg. She continues with daily
 Procardia XL 60 mg.

 Elevated BMI TWG 33 lbs.

 She reports planned delivery in [DATE]-[DATE].
Recommendations

 Growth and BPP in 1 weeks

## 2020-09-30 ENCOUNTER — Other Ambulatory Visit: Payer: Self-pay

## 2020-09-30 DIAGNOSIS — O99213 Obesity complicating pregnancy, third trimester: Secondary | ICD-10-CM

## 2020-10-02 LAB — OB RESULTS CONSOLE HIV ANTIBODY (ROUTINE TESTING): HIV: NONREACTIVE

## 2020-10-02 LAB — OB RESULTS CONSOLE GBS: GBS: POSITIVE

## 2020-10-02 LAB — OB RESULTS CONSOLE RPR: RPR: NONREACTIVE

## 2020-10-05 ENCOUNTER — Other Ambulatory Visit
Admission: RE | Admit: 2020-10-05 | Discharge: 2020-10-05 | Disposition: A | Discharge status: Home / Self Care | Payer: Managed Care, Other (non HMO) | Source: Ambulatory Visit | Attending: Obstetrics and Gynecology | Admitting: Obstetrics and Gynecology

## 2020-10-05 ENCOUNTER — Other Ambulatory Visit: Payer: Self-pay

## 2020-10-05 DIAGNOSIS — Z01812 Encounter for preprocedural laboratory examination: Secondary | ICD-10-CM | POA: Insufficient documentation

## 2020-10-05 DIAGNOSIS — Z20822 Contact with and (suspected) exposure to covid-19: Secondary | ICD-10-CM | POA: Insufficient documentation

## 2020-10-05 LAB — SARS CORONAVIRUS 2 (TAT 6-24 HRS): SARS Coronavirus 2: NEGATIVE

## 2020-10-06 ENCOUNTER — Other Ambulatory Visit: Payer: Self-pay | Admitting: Obstetrics and Gynecology

## 2020-10-06 ENCOUNTER — Other Ambulatory Visit: Payer: Commercial Managed Care - HMO

## 2020-10-06 DIAGNOSIS — O10913 Unspecified pre-existing hypertension complicating pregnancy, third trimester: Secondary | ICD-10-CM

## 2020-10-06 DIAGNOSIS — Z113 Encounter for screening for infections with a predominantly sexual mode of transmission: Secondary | ICD-10-CM

## 2020-10-06 NOTE — Progress Notes (Signed)
Additional induction orders

## 2020-10-07 ENCOUNTER — Encounter: Payer: Self-pay | Admitting: Obstetrics and Gynecology

## 2020-10-07 ENCOUNTER — Inpatient Hospital Stay
Admission: EM | Admit: 2020-10-07 | Discharge: 2020-10-10 | DRG: 784 | Disposition: A | Discharge status: Home / Self Care | Payer: Managed Care, Other (non HMO) | Attending: Obstetrics and Gynecology | Admitting: Obstetrics and Gynecology

## 2020-10-07 ENCOUNTER — Other Ambulatory Visit: Payer: Self-pay

## 2020-10-07 DIAGNOSIS — O10919 Unspecified pre-existing hypertension complicating pregnancy, unspecified trimester: Secondary | ICD-10-CM | POA: Diagnosis present

## 2020-10-07 DIAGNOSIS — Z9889 Other specified postprocedural states: Secondary | ICD-10-CM

## 2020-10-07 DIAGNOSIS — O9081 Anemia of the puerperium: Secondary | ICD-10-CM | POA: Diagnosis not present

## 2020-10-07 DIAGNOSIS — D259 Leiomyoma of uterus, unspecified: Secondary | ICD-10-CM | POA: Diagnosis present

## 2020-10-07 DIAGNOSIS — G35 Multiple sclerosis: Secondary | ICD-10-CM | POA: Diagnosis present

## 2020-10-07 DIAGNOSIS — O99354 Diseases of the nervous system complicating childbirth: Secondary | ICD-10-CM | POA: Diagnosis present

## 2020-10-07 DIAGNOSIS — O3413 Maternal care for benign tumor of corpus uteri, third trimester: Secondary | ICD-10-CM | POA: Diagnosis present

## 2020-10-07 DIAGNOSIS — O10913 Unspecified pre-existing hypertension complicating pregnancy, third trimester: Secondary | ICD-10-CM

## 2020-10-07 DIAGNOSIS — Z302 Encounter for sterilization: Secondary | ICD-10-CM

## 2020-10-07 DIAGNOSIS — Z20822 Contact with and (suspected) exposure to covid-19: Secondary | ICD-10-CM | POA: Diagnosis present

## 2020-10-07 DIAGNOSIS — D62 Acute posthemorrhagic anemia: Secondary | ICD-10-CM | POA: Diagnosis not present

## 2020-10-07 DIAGNOSIS — O99214 Obesity complicating childbirth: Secondary | ICD-10-CM | POA: Diagnosis present

## 2020-10-07 DIAGNOSIS — O99824 Streptococcus B carrier state complicating childbirth: Secondary | ICD-10-CM | POA: Diagnosis present

## 2020-10-07 DIAGNOSIS — O1002 Pre-existing essential hypertension complicating childbirth: Principal | ICD-10-CM | POA: Diagnosis present

## 2020-10-07 DIAGNOSIS — Z3A37 37 weeks gestation of pregnancy: Secondary | ICD-10-CM

## 2020-10-07 DIAGNOSIS — Z880 Allergy status to penicillin: Secondary | ICD-10-CM

## 2020-10-07 DIAGNOSIS — Z113 Encounter for screening for infections with a predominantly sexual mode of transmission: Secondary | ICD-10-CM

## 2020-10-07 LAB — WET PREP, GENITAL
Clue Cells Wet Prep HPF POC: NONE SEEN
Sperm: NONE SEEN
Trich, Wet Prep: NONE SEEN
Yeast Wet Prep HPF POC: NONE SEEN

## 2020-10-07 LAB — COMPREHENSIVE METABOLIC PANEL
ALT: 15 U/L (ref 0–44)
AST: 21 U/L (ref 15–41)
Albumin: 3.1 g/dL — ABNORMAL LOW (ref 3.5–5.0)
Alkaline Phosphatase: 106 U/L (ref 38–126)
Anion gap: 10 (ref 5–15)
BUN: 18 mg/dL (ref 6–20)
CO2: 19 mmol/L — ABNORMAL LOW (ref 22–32)
Calcium: 9.5 mg/dL (ref 8.9–10.3)
Chloride: 104 mmol/L (ref 98–111)
Creatinine, Ser: 0.91 mg/dL (ref 0.44–1.00)
GFR, Estimated: >60 mL/min (ref >60)
Glucose, Bld: 94 mg/dL (ref 70–99)
Potassium: 3.6 mmol/L (ref 3.5–5.1)
Sodium: 133 mmol/L — ABNORMAL LOW (ref 135–145)
Total Bilirubin: 0.8 mg/dL (ref 0.3–1.2)
Total Protein: 7.3 g/dL (ref 6.5–8.1)

## 2020-10-07 LAB — TYPE AND SCREEN
ABO/RH(D): O POS
Antibody Screen: NEGATIVE

## 2020-10-07 LAB — CBC
HCT: 29.4 % — ABNORMAL LOW (ref 36.0–46.0)
Hemoglobin: 10.5 g/dL — ABNORMAL LOW (ref 12.0–15.0)
MCH: 32.5 pg (ref 26.0–34.0)
MCHC: 35.7 g/dL (ref 30.0–36.0)
MCV: 91 fL (ref 80.0–100.0)
Platelets: 398 10*3/uL (ref 150–400)
RBC: 3.23 MIL/uL — ABNORMAL LOW (ref 3.87–5.11)
RDW: 13.4 % (ref 11.5–15.5)
WBC: 16.8 10*3/uL — ABNORMAL HIGH (ref 4.0–10.5)
nRBC: 0 % (ref 0.0–0.2)

## 2020-10-07 LAB — PROTEIN / CREATININE RATIO, URINE
Creatinine, Urine: 123 mg/dL
Protein Creatinine Ratio: 0.67 mg/mg{Cre} — ABNORMAL HIGH (ref 0.00–0.15)
Total Protein, Urine: 83 mg/dL

## 2020-10-07 LAB — ABO/RH: ABO/RH(D): O POS

## 2020-10-07 MED ORDER — TERBUTALINE SULFATE 1 MG/ML IJ SOLN: 0.2500 mg | Freq: Once | INTRAMUSCULAR | Status: DC | PRN

## 2020-10-07 MED ORDER — OXYTOCIN BOLUS FROM INFUSION: 333.0000 mL | Freq: Once | INTRAVENOUS | Status: DC

## 2020-10-07 MED ORDER — MISOPROSTOL 25 MCG QUARTER TABLET: 25.0000 ug | ORAL_TABLET | ORAL | Status: DC | PRN

## 2020-10-07 MED ORDER — FENTANYL CITRATE (PF) 100 MCG/2ML IJ SOLN
100.0000 ug | Freq: Once | INTRAMUSCULAR | Status: AC
  Administered 2020-10-07: 100 ug via INTRAVENOUS
  Filled 2020-10-07: qty 2

## 2020-10-07 MED ORDER — SOD CITRATE-CITRIC ACID 500-334 MG/5ML PO SOLN: 30.0000 mL | ORAL | Status: DC | PRN

## 2020-10-07 MED ORDER — OXYTOCIN-SODIUM CHLORIDE 30-0.9 UT/500ML-% IV SOLN
1.0000 m[IU]/min | INTRAVENOUS | Status: DC
Start: 2020-10-07 — End: 2020-10-08
  Administered 2020-10-07: 2 m[IU]/min via INTRAVENOUS
  Filled 2020-10-07: qty 500

## 2020-10-07 MED ORDER — BUTORPHANOL TARTRATE 1 MG/ML IJ SOLN
1.0000 mg | INTRAMUSCULAR | Status: DC | PRN
  Administered 2020-10-07 (×2): 1 mg via INTRAVENOUS
  Filled 2020-10-07 (×2): qty 1

## 2020-10-07 MED ORDER — MISOPROSTOL 25 MCG QUARTER TABLET
50.0000 ug | ORAL_TABLET | ORAL | Status: DC | PRN
Start: 2020-10-07 — End: 2020-10-08
  Administered 2020-10-07 (×2): 50 ug via BUCCAL
  Filled 2020-10-07: qty 2
  Filled 2020-10-07 (×3): qty 1

## 2020-10-07 MED ORDER — NIFEDIPINE ER OSMOTIC RELEASE 30 MG PO TB24
60.0000 mg | ORAL_TABLET | Freq: Every day | ORAL | Status: DC
  Administered 2020-10-07 – 2020-10-10 (×4): 60 mg via ORAL
  Filled 2020-10-07 (×4): qty 2

## 2020-10-07 MED ORDER — CALCIUM CARBONATE ANTACID 500 MG PO CHEW: 400.0000 mg | CHEWABLE_TABLET | Freq: Three times a day (TID) | ORAL | Status: DC | PRN

## 2020-10-07 MED ORDER — LIDOCAINE HCL (PF) 1 % IJ SOLN: 30.0000 mL | INTRAMUSCULAR | Status: DC | PRN

## 2020-10-07 MED ORDER — CEFAZOLIN SODIUM-DEXTROSE 2-4 GM/100ML-% IV SOLN
2.0000 g | Freq: Once | INTRAVENOUS | Status: AC
  Administered 2020-10-08: 2 g via INTRAVENOUS

## 2020-10-07 MED ORDER — CEFAZOLIN SODIUM-DEXTROSE 1-4 GM/50ML-% IV SOLN
1.0000 g | Freq: Three times a day (TID) | INTRAVENOUS | Status: DC
Start: 2020-10-07 — End: 2020-10-08

## 2020-10-07 MED ORDER — OXYTOCIN-SODIUM CHLORIDE 30-0.9 UT/500ML-% IV SOLN
2.5000 [IU]/h | INTRAVENOUS | Status: DC
  Administered 2020-10-08: 30 [IU] via INTRAVENOUS
  Administered 2020-10-08: 2.5 [IU]/h via INTRAVENOUS
  Filled 2020-10-07: qty 1000

## 2020-10-07 MED ORDER — LIDOCAINE HCL URETHRAL/MUCOSAL 2 % EX GEL
1.0000 "application " | CUTANEOUS | Status: DC | PRN
  Administered 2020-10-07: 1 via TOPICAL
  Filled 2020-10-07 (×3): qty 5

## 2020-10-07 MED ORDER — ACETAMINOPHEN 500 MG PO TABS
1000.0000 mg | ORAL_TABLET | Freq: Four times a day (QID) | ORAL | Status: DC | PRN
Start: 2020-10-07 — End: 2020-10-08

## 2020-10-07 MED ORDER — ONDANSETRON HCL 4 MG/2ML IJ SOLN
4.0000 mg | Freq: Four times a day (QID) | INTRAMUSCULAR | Status: DC | PRN
Start: 2020-10-07 — End: 2020-10-08

## 2020-10-07 MED ORDER — LACTATED RINGERS IV SOLN: 500.0000 mL | INTRAVENOUS | Status: DC | PRN

## 2020-10-07 MED ORDER — LACTATED RINGERS IV SOLN: INTRAVENOUS | Status: DC

## 2020-10-07 NOTE — Progress Notes (Signed)
Labor Progress Note  Terry Ramos is a 35 y.o. G1P0 at [redacted]w[redacted]d by LMP admitted for induction of labor due to chronic HTN in pregnancy.  Subjective: feeling more uncomfortable with contractions   Objective: BP 138/89 (BP Location: Right Arm)   Pulse 93   Temp 97.9 F (36.6 C) (Oral)   Resp 18   Ht 5\' 4"  (1.626 m)   Wt 92.1 kg   LMP 02/17/2019   BMI 34.84 kg/m  Notable VS details: reviewed   Fetal Assessment: FHT:  FHR: 135 bpm, variability: moderate,  accelerations:  Present,  decelerations:  Absent Category/reactivity:  Category I UC:   regular, every 2-3 minutes SVE:   deferred - cook cath in place Membrane status:N/A  Amniotic color: N/A  Labs: Lab Results  Component Value Date   WBC 16.8 (H) 10/07/2020   HGB 10.5 (L) 10/07/2020   HCT 29.4 (L) 10/07/2020   MCV 91.0 10/07/2020   PLT 398 10/07/2020    Assessment / Plan: IOL d/t chronic HTN -s/p 2 doses of misoprostol -Cook cath in place  -Oxytocin infusing   Labor:  Latent labor - cervical ripening with cook cath Preeclampsia:   no s/s Fetal Wellbeing:  Category I Pain Control:  Labor support without medications I/D:   GBS positive - Ancef started for IP prophylaxis  Anticipated MOD:  NSVD  Minda Meo, CNM 10/07/2020, 1:07 PM

## 2020-10-07 NOTE — Progress Notes (Signed)
Labor Progress Note  Terry Ramos is a 35 y.o. G1P0 at [redacted]w[redacted]d by LMP admitted for induction of labor due to chronic HTN.  Subjective: resting with IVPM  Objective: BP 125/89 (BP Location: Right Arm)   Pulse 97   Temp 98.2 F (36.8 C) (Oral)   Resp 20   Ht 5\' 4"  (1.626 m)   Wt 92.1 kg   LMP 02/17/2019   BMI 34.84 kg/m  Notable VS details: reviewed   Fetal Assessment: FHT:  FHR: 145 bpm, variability: moderate,  accelerations:  Present,  decelerations:  Absent Category/reactivity:  Category I UC:   regular, every 2-3 minutes SVE:   deferred - cook cath in place Membrane status:N/A Amniotic color: N/A  Labs: Lab Results  Component Value Date   WBC 16.8 (H) 10/07/2020   HGB 10.5 (L) 10/07/2020   HCT 29.4 (L) 10/07/2020   MCV 91.0 10/07/2020   PLT 398 10/07/2020    Assessment / Plan: IOL d/t chronic HTN in pregnancy  -s/p 2 doses of misoprostol  -Cook cath placed this morning at 0730 - in place -Oxytocin infusing at 14 milliunits/min -Plan to deflate/remove cook cath at [employer] -Will discuss plan of care with Dr. Ouida Sills.  Consider stopping oxytocin to allow patient to eat and rest with restarting buccal misoprostol tonight.   Labor:  Latent labor - cervical ripening  Preeclampsia:   no s/s Fetal Wellbeing:  Category I Pain Control:  IV pain meds I/D:  n/a Anticipated MOD:  NSVD  Minda Meo, CNM 10/07/2020, 4:55 PM

## 2020-10-07 NOTE — H&P (Signed)
OB History & Physical   History of Present Illness:  Chief Complaint: induction of labor  HPI:  Terry Ramos is a 35 y.o. G1P0 female at [redacted]w[redacted]d dated by [place] at [redacted]w[redacted]d.  She presents to L&D for scheduled induction of labor for Chronic htn in pregnancy and multiple sclerosis  Active FM, denies UCs, LOF or VB on admit.  Now is feeling severe menstrual-like cramping after 2 doses of cytotec.    Pregnancy Issues: Chronic HTN in pregnancy Multiple sclerosis - new diagnosis Obesity in pregnancy Learning disability Social concerns with FOB Uterine fibroids    Maternal Medical History:   Past Medical History:  Diagnosis Date   Hypertension    Multiple sclerosis ([place])     Past Surgical History:  Procedure Laterality Date   ABDOMINAL SURGERY      Allergies  Allergen Reactions   Penicillins     Did it involve swelling of the face/tongue/throat, SOB, or low BP? No Did it involve sudden or severe rash/hives, skin peeling, or any reaction on the inside of your mouth or nose? Yes Did you need to seek medical attention at a hospital or doctor's office? Unknown When did it last happen?       If all above answers are "NO", may proceed with cephalosporin use.    Prior to Admission medications   Medication Sig Start Date End Date Taking? Authorizing Provider  aspirin 81 MG chewable tablet Chew by mouth daily.    [provider]  B Complex-C (B-COMPLEX WITH VITAMIN C) tablet Take 1 tablet by mouth daily.    [provider]  cholecalciferol (VITAMIN D3) 25 MCG (1000 UNIT) tablet Take 1,000 Units by mouth daily.    [provider]  NIFEdipine (PROCARDIA XL/NIFEDICAL XL) 60 MG 24 hr tablet Take 60 mg by mouth daily. 08/28/20   [provider]  Prenatal Vit-Fe Fumarate-FA (PRENATAL MULTIVITAMIN) TABS tablet Take 1 tablet by mouth daily at 12 noon.    [provider]  vitamin B-12 (CYANOCOBALAMIN) 1000 MCG tablet Take 5,000 mcg by mouth daily.     [provider]     Prenatal care site: [place]   Social History: She  reports that she has never smoked. She has never used smokeless tobacco. She reports that she does not drink alcohol and does not use drugs.  Family History: family history includes Alopecia in her father; Colitis in her maternal aunt; Diabetes in her maternal grandmother, maternal uncle, and paternal grandmother; Heart disease in her paternal grandmother; Hyperlipidemia in her maternal aunt, maternal uncle, and mother; Hypertension in her maternal aunt, maternal aunt, and mother; Irritable bowel syndrome in her sister; Kidney disease in her maternal grandmother; Multiple sclerosis in her maternal grandmother; Osteoarthritis in her mother; Prostate cancer in her maternal grandfather; Schizophrenia in her maternal aunt; Sickle cell anemia in her maternal aunt; Stomach cancer in her paternal grandfather.   Review of Systems: A full review of systems was performed and negative except as noted in the HPI.     Physical Exam:  Vital Signs: BP 123/82   Pulse 96   Temp 98.7 F (37.1 C) (Oral)   Resp 16   Ht 5\' 4"  (1.626 m)   Wt 92.1 kg   LMP 02/17/2019   BMI 34.84 kg/m  General: no acute distress.  HEENT: normocephalic, atraumatic Heart: regular rate & rhythm.  No murmurs/rubs/gallops Lungs: clear to auscultation bilaterally, normal respiratory effort Abdomen: soft, gravid, non-tender;  EFW: 7lbs Pelvic:  External: Normal external female genitalia  Cervix: Dilation: 1 / Effacement (%): 50 / Station: -3    Extremities: non-tender, symmetric, No edema bilaterally.  DTRs: 2+  Neurologic: Alert & oriented x 3.    Results for orders placed or performed during the hospital encounter of 10/07/20 (from the past 24 hour(s))  CBC     Status: Abnormal   Collection Time: 10/07/20  1:12 AM  Result Value Ref Range   WBC 16.8 (H) 4.0 - 10.5 K/uL   RBC 3.23 (L) 3.87 - 5.11 MIL/uL   Hemoglobin 10.5 (L)  12.0 - 15.0 g/dL   HCT 29.4 (L) 36.0 - 46.0 %   MCV 91.0 80.0 - 100.0 fL   MCH 32.5 26.0 - 34.0 pg   MCHC 35.7 30.0 - 36.0 g/dL   RDW 13.4 11.5 - 15.5 %   Platelets 398 150 - 400 K/uL   nRBC 0.0 0.0 - 0.2 %  Type and screen     Status: None   Collection Time: 10/07/20  1:12 AM  Result Value Ref Range   ABO/RH(D) O POS    Antibody Screen NEG    Sample Expiration      10/10/2020,2359 Performed at [facility], [address] [place], [place] [address]   Comprehensive metabolic panel     Status: Abnormal   Collection Time: 10/07/20  1:12 AM  Result Value Ref Range   Sodium 133 (L) 135 - 145 mmol/L   Potassium 3.6 3.5 - 5.1 mmol/L   Chloride 104 98 - 111 mmol/L   CO2 19 (L) 22 - 32 mmol/L   Glucose, Bld 94 70 - 99 mg/dL   BUN 18 6 - 20 mg/dL   Creatinine, Ser 0.91 0.44 - 1.00 mg/dL   Calcium 9.5 8.9 - 10.3 mg/dL   Total Protein 7.3 6.5 - 8.1 g/dL   Albumin 3.1 (L) 3.5 - 5.0 g/dL   AST 21 15 - 41 U/L   ALT 15 0 - 44 U/L   Alkaline Phosphatase 106 38 - 126 U/L   Total Bilirubin 0.8 0.3 - 1.2 mg/dL   GFR, Estimated >60 >60 mL/min   Anion gap 10 5 - 15  ABO/Rh     Status: None   Collection Time: 10/07/20  2:46 AM  Result Value Ref Range   ABO/RH(D)      O POS Performed at [facility], [address], [place], [place] [address]     Pertinent Results:  Prenatal Labs: Blood type/Rh O Pos  Antibody screen neg  Rubella Immune  Varicella Immune  RPR NR  HBsAg Neg  HIV NR  GC neg  Chlamydia neg  Genetic screening negative  1 hour GTT  135  3 hour GTT   GBS  Pos- clinda resistant, (PCN allergy)   FHT: 140bpm, mod variability, + accels, intermittent late decels noted.  TOCO: q3-64min, palpate mild.   SVE:  Dilation: 1 / Effacement (%): 50 / Station: -3  - Cook cath placed after pt pre-medicated with Fentanyl 124mcg IV, lidocaine jelly for lubrication used for pt comfort. Cervix soft, with small amount bloody show. Uterine and vaginal balloons  inflated with 2ml each LR.    Cephalic by leopolds/US  [place] MFM FETAL BPP W/NONSTRESS  Result Date: 09/08/2020 ----------------------------------------------------------------------  OBSTETRICS REPORT                       (Signed Final 09/08/2020 10:23 am) ---------------------------------------------------------------------- Patient Info  ID #:  [id number]                          D.O.B.:  1986/03/07 (35 yrs)  Name:       Terry Ramos                   Visit Date: 09/08/2020 09:44 am ---------------------------------------------------------------------- Performed By  Attending:        Tama High MD        Referred By:      Minda Meo  Performed By:     Nanetta Batty      [facility]                                                             [facility] ---------------------------------------------------------------------- Orders  #  Description                           Code        Ordered By  1  [place] MFM FETAL BPP                      97989.2     ANNA MACKIE     W/NONSTRESS ----------------------------------------------------------------------  #  Order #                     Accession #                Episode #  1  [id number]                   [id number]                 [id number] ---------------------------------------------------------------------- Indications  [redacted] weeks gestation of pregnancy                Z3A.33  Hypertension - Chronic/Pre-existing            H88.502  Obesity complicating pregnancy, third          O99.213  trimester ---------------------------------------------------------------------- Fetal Evaluation  Num Of Fetuses:         1  Fetal Heart Rate(bpm):  135  Cardiac Activity:       Observed  Presentation:           Cephalic  Placenta:               Anterior  Amniotic Fluid  AFI FV:      Within normal limits  AFI Sum(cm)     %Tile  17.2            63  ---------------------------------------------------------------------- Biophysical Evaluation  Amniotic F.V:   Within normal limits       F. Tone:        Observed  F. Movement:  Observed                   N.S.T:          Reactive  F. Breathing:   Not Observed               Score:          8/10 ---------------------------------------------------------------------- Gestational Age  LMP:           29w 1d        Date:  02/17/20                 EDD:   11/23/20  Best:          33w 0d     Det. By:  Loman Chroman         EDD:   10/27/20                                      (03/24/20) ---------------------------------------------------------------------- Anatomy  Stomach:               Appears normal, left   Bladder:                Appears normal                         sided  Kidneys:               Appear normal ---------------------------------------------------------------------- Impression  Chronic hypertension. Patient takes nifedipine XL 30 mg  daily.  Blood pressure today at our office is 121/95 mm Hg.  Amniotic fluid is normal and good fetal activity is seen. Fetal  breathing movements did not meet the criteria of BPP. NST is  reactive. BPP 8.10.  We reassured the patient of the findings. ---------------------------------------------------------------------- Recommendations  Follow-up scans as clinically indicated. ----------------------------------------------------------------------                  Tama High, MD Electronically Signed Final Report   09/08/2020 10:23 am ----------------------------------------------------------------------  [place] MFM FETAL BPP WO NON STRESS  Result Date: 09/29/2020 ----------------------------------------------------------------------  OBSTETRICS REPORT                       (Signed Final 09/29/2020 03:34 pm) ---------------------------------------------------------------------- Patient Info  ID #:       [id number]                          D.O.B.:  [birth year] (35 yrs)  Name:        Terry Ramos                   Visit Date: 09/29/2020 03:19 pm ---------------------------------------------------------------------- Performed By  Attending:        Sander Nephew      Referred By:      Minda Meo                    MD  Performed By:     Nanetta Batty      Location[facility]         [facility]                                                             [facility] ---------------------------------------------------------------------- Orders  #  Description                           Code        Ordered By  1  [place] MFM FETAL BPP WO NON               76819.01    ANNA MACKIE     STRESS ----------------------------------------------------------------------  #  Order #                     Accession #                Episode #  1  [id number]                   [id number]                 [id number] ---------------------------------------------------------------------- Indications  [redacted] weeks gestation of pregnancy                Z3A.36  Unspecified maternal hypertension, third       O16.3  trimester  Obesity complicating pregnancy, third          O99.213  trimester ---------------------------------------------------------------------- Fetal Evaluation  Num Of Fetuses:         1  Fetal Heart Rate(bpm):  160  Cardiac Activity:       Observed  Presentation:           Cephalic  Placenta:               Anterior  Amniotic Fluid  AFI FV:      Within normal limits  AFI Sum(cm)     %Tile  16.3            60 ---------------------------------------------------------------------- Biophysical Evaluation  Amniotic F.V:   Within normal limits       F. Tone:        Observed  F. Movement:    Observed                   Score:          8/8  F. Breathing:   Observed ---------------------------------------------------------------------- Gestational Age  LMP:           32w 1d        Date:  02/17/20                 EDD:   11/23/20  Best:           Harolyn Rutherford 0d     Det. ByLoman Chroman         EDD:   10/27/20                                      (03/24/20) ---------------------------------------------------------------------- Impression  Antenatal testing performed given maternal elevated BMI,  chronic hypertension, and new diagnosis of MS.  The biophysical profile was 8/8 with good fetal movement and  amniotic fluid  volume.  Blood pressure is 135/91 mmHg. She continues with daily  Procardia XL 60 mg.  Elevated BMI TWG 33 lbs.  She reports planned delivery in 6/28-6/29. ---------------------------------------------------------------------- Recommendations  Growth and BPP in 1 weeks ----------------------------------------------------------------------               Sander Nephew, MD Electronically Signed Final Report   09/29/2020 03:34 pm ----------------------------------------------------------------------  [place] MFM FETAL BPP WO NON STRESS  Result Date: 09/22/2020 ----------------------------------------------------------------------  OBSTETRICS REPORT                       (Signed Final 09/22/2020 10:08 am) ---------------------------------------------------------------------- Patient Info  ID #:       [id number]                          D.O.B.:  [birth year] (35 yrs)  Name:       Terry Ramos                   Visit Date: 09/22/2020 09:21 am ---------------------------------------------------------------------- Performed By  Attending:        Johnell Comings MD         Referred By:      Minda Meo  Performed By:     Christena Deem RDMS        [facility]         [facility] at                                                             [facility] ---------------------------------------------------------------------- Orders  #  Description                           Code        Ordered By  1  [place] MFM FETAL BPP WO NON               95093.26    ANNA [facility]     STRESS  ----------------------------------------------------------------------  #  Order #                     Accession #                Episode #  1  [id number]                   [id number]                 [id number] ---------------------------------------------------------------------- Indications  Hypertension - Chronic/Pre-existing            O10.019  [redacted] weeks gestation of pregnancy                T0W.40  Obesity complicating pregnancy, third          O99.213  trimester ---------------------------------------------------------------------- Fetal Evaluation  Num Of Fetuses:  1  Fetal Heart Rate(bpm):  148  Cardiac Activity:       Observed  Presentation:           Cephalic  Placenta:               Anterior  P. Cord Insertion:      Previously Visualized  AFI Sum(cm)     %Tile       Largest Pocket(cm)  14              50          4  RUQ(cm)       RLQ(cm)       LUQ(cm)        LLQ(cm)  3.8           3.6           4              2.6 ---------------------------------------------------------------------- Biophysical Evaluation  Amniotic F.V:   Within normal limits       F. Tone:        Observed  F. Movement:    Observed                   Score:          8/8  F. Breathing:   Observed ---------------------------------------------------------------------- Gestational Age  LMP:           31w 1d        Date:  02/17/20                 EDD:   11/23/20  Best:          Barbie Haggis 0d     Det. By:  Loman Chroman         EDD:   10/27/20                                      (03/24/20) ---------------------------------------------------------------------- Comments  This patient was seen for a biophysical profile due to chronic  hypertension currently treated with Procardia 60 mg daily.  She denies any problems since her last exam and reports  feeling vigorous fetal movements throughout the day.  A biophysical profile performed today was 8 out of 8.  There was normal amniotic fluid noted on today's ultrasound  exam.  Due to chronic  hypertension, the patient reports that delivery  is planned at around 37 weeks.  She should continue twice  weekly fetal testing until delivery.  The patient will have another nonstress test performed in  your office later this week.  She will return next week to our  office for another biophysical profile. ----------------------------------------------------------------------                   Johnell Comings, MD Electronically Signed Final Report   09/22/2020 10:08 am ----------------------------------------------------------------------   Assessment:  Terry Ramos is a 35 y.o. G1P0 female at [redacted]w[redacted]d with IOL for [facility].   Plan:  1. Admit to Labor & Delivery; consents reviewed and obtained  2. Fetal Well being  - Fetal Tracing: Cat II - Group B Streptococcus ppx indicated: Positive, will need Vancomycin due to PCN allergy with hx rash/hives - Presentation: cephalic confirmed by exm/US   3. Routine OB: - Prenatal labs reviewed, as above - Rh O pos - CBC, T&S, RPR on admit - Clear fluids, IVF  4. Induction  of Labor -  Contractions: external toco in place -  Pelvis padequate for TOL -  Plan for induction with Cytotec, Cook cath -  Plan for continuous fetal monitoring  -  Maternal pain control as desired - Anticipate vaginal delivery  5. Post Partum Planning: Tdap: given 08/13/2020  Flu: given 06/12/2020 Contraception: BTL - consents signed 08/13/2020 Feeding preference: formula   Francetta Found, CNM 10/07/20 7:39 AM

## 2020-10-08 ENCOUNTER — Inpatient Hospital Stay: Payer: Managed Care, Other (non HMO) | Admitting: Registered Nurse

## 2020-10-08 ENCOUNTER — Encounter: Payer: Self-pay | Admitting: Obstetrics and Gynecology

## 2020-10-08 ENCOUNTER — Encounter
Admission: EM | Disposition: A | Discharge status: Home / Self Care | Payer: Self-pay | Source: Home / Self Care | Attending: Obstetrics and Gynecology

## 2020-10-08 DIAGNOSIS — Z9889 Other specified postprocedural states: Secondary | ICD-10-CM

## 2020-10-08 LAB — CBC
HCT: 29 % — ABNORMAL LOW (ref 36.0–46.0)
Hemoglobin: 10.2 g/dL — ABNORMAL LOW (ref 12.0–15.0)
MCH: 32.1 pg (ref 26.0–34.0)
MCHC: 35.2 g/dL (ref 30.0–36.0)
MCV: 91.2 fL (ref 80.0–100.0)
Platelets: 400 10*3/uL (ref 150–400)
RBC: 3.18 MIL/uL — ABNORMAL LOW (ref 3.87–5.11)
RDW: 13.5 % (ref 11.5–15.5)
WBC: 18.9 10*3/uL — ABNORMAL HIGH (ref 4.0–10.5)
nRBC: 0 % (ref 0.0–0.2)

## 2020-10-08 LAB — RPR: RPR Ser Ql: NONREACTIVE

## 2020-10-08 SURGERY — Surgical Case
Anesthesia: Spinal

## 2020-10-08 MED ORDER — LIDOCAINE HCL (PF) 1 % IJ SOLN
INTRAMUSCULAR | Status: DC | PRN
  Administered 2020-10-08: 3 mL via SUBCUTANEOUS

## 2020-10-08 MED ORDER — IBUPROFEN 600 MG PO TABS
600.0000 mg | ORAL_TABLET | Freq: Four times a day (QID) | ORAL | Status: DC
  Administered 2020-10-09 – 2020-10-10 (×4): 600 mg via ORAL
  Filled 2020-10-08 (×4): qty 1

## 2020-10-08 MED ORDER — NALBUPHINE HCL 10 MG/ML IJ SOLN: 5.0000 mg | Freq: Once | INTRAMUSCULAR | Status: DC | PRN

## 2020-10-08 MED ORDER — SIMETHICONE 80 MG PO CHEW
80.0000 mg | CHEWABLE_TABLET | ORAL | Status: DC | PRN
  Filled 2020-10-08: qty 1

## 2020-10-08 MED ORDER — WITCH HAZEL-GLYCERIN EX PADS: 1.0000 "application " | MEDICATED_PAD | CUTANEOUS | Status: DC | PRN

## 2020-10-08 MED ORDER — OXYCODONE HCL 5 MG PO TABS
5.0000 mg | ORAL_TABLET | ORAL | Status: DC | PRN
Start: 2020-10-08 — End: 2020-10-10
  Administered 2020-10-09: 5 mg via ORAL
  Administered 2020-10-09: 10 mg via ORAL
  Administered 2020-10-09: 5 mg via ORAL
  Administered 2020-10-10 (×2): 10 mg via ORAL
  Filled 2020-10-08 (×2): qty 2
  Filled 2020-10-08: qty 1
  Filled 2020-10-08 (×2): qty 2
  Filled 2020-10-08: qty 1

## 2020-10-08 MED ORDER — ONDANSETRON HCL 4 MG/2ML IJ SOLN
INTRAMUSCULAR | Status: DC | PRN
  Administered 2020-10-08: 4 mg via INTRAVENOUS

## 2020-10-08 MED ORDER — CEFAZOLIN SODIUM-DEXTROSE 2-4 GM/100ML-% IV SOLN
2.0000 g | Freq: Once | INTRAVENOUS | Status: DC
  Filled 2020-10-08: qty 100

## 2020-10-08 MED ORDER — PRENATAL MULTIVITAMIN CH
1.0000 | ORAL_TABLET | Freq: Every day | ORAL | Status: DC
  Administered 2020-10-09: 1 via ORAL
  Filled 2020-10-08: qty 1

## 2020-10-08 MED ORDER — SENNOSIDES-DOCUSATE SODIUM 8.6-50 MG PO TABS
2.0000 | ORAL_TABLET | Freq: Every day | ORAL | Status: DC
  Administered 2020-10-09 – 2020-10-10 (×2): 2 via ORAL
  Filled 2020-10-08 (×2): qty 2

## 2020-10-08 MED ORDER — FENTANYL CITRATE (PF) 100 MCG/2ML IJ SOLN
INTRAMUSCULAR | Status: DC | PRN
  Administered 2020-10-08: 15 ug via INTRATHECAL

## 2020-10-08 MED ORDER — DIPHENHYDRAMINE HCL 50 MG/ML IJ SOLN: 12.5000 mg | INTRAMUSCULAR | Status: DC | PRN

## 2020-10-08 MED ORDER — NALOXONE HCL 0.4 MG/ML IJ SOLN: 0.4000 mg | INTRAMUSCULAR | Status: DC | PRN

## 2020-10-08 MED ORDER — ONDANSETRON HCL 4 MG/2ML IJ SOLN: 4.0000 mg | Freq: Three times a day (TID) | INTRAMUSCULAR | Status: DC | PRN

## 2020-10-08 MED ORDER — MORPHINE SULFATE (PF) 0.5 MG/ML IJ SOLN
INTRAMUSCULAR | Status: DC | PRN
  Administered 2020-10-08: .1 mg via INTRATHECAL

## 2020-10-08 MED ORDER — ONDANSETRON HCL 4 MG/2ML IJ SOLN
INTRAMUSCULAR | Status: AC
  Filled 2020-10-08: qty 2

## 2020-10-08 MED ORDER — MORPHINE SULFATE (PF) 0.5 MG/ML IJ SOLN
INTRAMUSCULAR | Status: AC
  Filled 2020-10-08: qty 10

## 2020-10-08 MED ORDER — TRANEXAMIC ACID-NACL 1000-0.7 MG/100ML-% IV SOLN
INTRAVENOUS | Status: AC
  Filled 2020-10-08: qty 100

## 2020-10-08 MED ORDER — CARBOPROST TROMETHAMINE 250 MCG/ML IM SOLN
INTRAMUSCULAR | Status: AC
  Filled 2020-10-08: qty 1

## 2020-10-08 MED ORDER — MENTHOL 3 MG MT LOZG
1.0000 | LOZENGE | OROMUCOSAL | Status: DC | PRN
  Filled 2020-10-08: qty 9

## 2020-10-08 MED ORDER — GABAPENTIN 300 MG PO CAPS
300.0000 mg | ORAL_CAPSULE | Freq: Every day | ORAL | Status: DC
  Administered 2020-10-09: 300 mg via ORAL
  Filled 2020-10-08 (×3): qty 1

## 2020-10-08 MED ORDER — ACETAMINOPHEN 500 MG PO TABS: 1000.0000 mg | ORAL_TABLET | Freq: Four times a day (QID) | ORAL | Status: DC

## 2020-10-08 MED ORDER — BUPIVACAINE LIPOSOME 1.3 % IJ SUSP
INTRAMUSCULAR | Status: AC
  Filled 2020-10-08: qty 20

## 2020-10-08 MED ORDER — SIMETHICONE 80 MG PO CHEW
80.0000 mg | CHEWABLE_TABLET | Freq: Three times a day (TID) | ORAL | Status: DC
  Administered 2020-10-08 – 2020-10-09 (×4): 80 mg via ORAL
  Filled 2020-10-08 (×4): qty 1

## 2020-10-08 MED ORDER — FENTANYL CITRATE (PF) 100 MCG/2ML IJ SOLN
INTRAMUSCULAR | Status: AC
  Filled 2020-10-08: qty 2

## 2020-10-08 MED ORDER — NALOXONE HCL 4 MG/10ML IJ SOLN
1.0000 ug/kg/h | INTRAVENOUS | Status: DC | PRN
  Filled 2020-10-08: qty 5

## 2020-10-08 MED ORDER — ENOXAPARIN SODIUM 40 MG/0.4ML IJ SOSY
40.0000 mg | PREFILLED_SYRINGE | INTRAMUSCULAR | Status: DC
  Administered 2020-10-09: 40 mg via SUBCUTANEOUS
  Filled 2020-10-08 (×2): qty 0.4

## 2020-10-08 MED ORDER — LACTATED RINGERS IV SOLN: INTRAVENOUS | Status: DC

## 2020-10-08 MED ORDER — SCOPOLAMINE 1 MG/3DAYS TD PT72: 1.0000 | MEDICATED_PATCH | Freq: Once | TRANSDERMAL | Status: DC

## 2020-10-08 MED ORDER — SOD CITRATE-CITRIC ACID 500-334 MG/5ML PO SOLN
ORAL | Status: AC
  Administered 2020-10-08: 30 mL
  Filled 2020-10-08: qty 15

## 2020-10-08 MED ORDER — BUPIVACAINE HCL (PF) 0.5 % IJ SOLN
INTRAMUSCULAR | Status: AC
  Filled 2020-10-08: qty 30

## 2020-10-08 MED ORDER — EPHEDRINE 5 MG/ML INJ
INTRAVENOUS | Status: AC
  Filled 2020-10-08: qty 4

## 2020-10-08 MED ORDER — DIPHENHYDRAMINE HCL 25 MG PO CAPS: 25.0000 mg | ORAL_CAPSULE | Freq: Four times a day (QID) | ORAL | Status: DC | PRN

## 2020-10-08 MED ORDER — DIBUCAINE (PERIANAL) 1 % EX OINT: 1.0000 "application " | TOPICAL_OINTMENT | CUTANEOUS | Status: DC | PRN

## 2020-10-08 MED ORDER — MORPHINE SULFATE (PF) 2 MG/ML IV SOLN
1.0000 mg | INTRAVENOUS | Status: DC | PRN
Start: 2020-10-08 — End: 2020-10-08

## 2020-10-08 MED ORDER — COCONUT OIL OIL
1.0000 "application " | TOPICAL_OIL | Status: DC | PRN
  Filled 2020-10-08: qty 120

## 2020-10-08 MED ORDER — KETOROLAC TROMETHAMINE 30 MG/ML IJ SOLN: 30.0000 mg | Freq: Four times a day (QID) | INTRAMUSCULAR | Status: DC | PRN

## 2020-10-08 MED ORDER — EPHEDRINE SULFATE 50 MG/ML IJ SOLN
INTRAMUSCULAR | Status: DC | PRN
  Administered 2020-10-08: 10 mg via INTRAVENOUS

## 2020-10-08 MED ORDER — OXYTOCIN-SODIUM CHLORIDE 30-0.9 UT/500ML-% IV SOLN: 2.5000 [IU]/h | INTRAVENOUS | Status: AC

## 2020-10-08 MED ORDER — NALBUPHINE HCL 10 MG/ML IJ SOLN: 5.0000 mg | INTRAMUSCULAR | Status: DC | PRN

## 2020-10-08 MED ORDER — KETOROLAC TROMETHAMINE 30 MG/ML IJ SOLN
30.0000 mg | Freq: Four times a day (QID) | INTRAMUSCULAR | Status: AC
  Administered 2020-10-08 – 2020-10-09 (×2): 30 mg via INTRAVENOUS
  Filled 2020-10-08 (×3): qty 1

## 2020-10-08 MED ORDER — SODIUM CHLORIDE (PF) 0.9 % IJ SOLN
INTRAMUSCULAR | Status: AC
  Filled 2020-10-08: qty 50

## 2020-10-08 MED ORDER — TETANUS-DIPHTH-ACELL PERTUSSIS 5-2.5-18.5 LF-MCG/0.5 IM SUSY
0.5000 mL | PREFILLED_SYRINGE | Freq: Once | INTRAMUSCULAR | Status: DC
  Filled 2020-10-08: qty 0.5

## 2020-10-08 MED ORDER — MEPERIDINE HCL 25 MG/ML IJ SOLN: 6.2500 mg | INTRAMUSCULAR | Status: DC | PRN

## 2020-10-08 MED ORDER — SODIUM CHLORIDE 0.9% FLUSH: 3.0000 mL | INTRAVENOUS | Status: DC | PRN

## 2020-10-08 MED ORDER — SODIUM CHLORIDE 0.9 % IV SOLN
INTRAVENOUS | Status: DC | PRN
  Administered 2020-10-08: 50 ug/min via INTRAVENOUS

## 2020-10-08 MED ORDER — SODIUM CHLORIDE FLUSH 0.9 % IV SOLN
INTRAVENOUS | Status: DC | PRN
  Administered 2020-10-08: 50 mL

## 2020-10-08 MED ORDER — ACETAMINOPHEN 500 MG PO TABS
1000.0000 mg | ORAL_TABLET | Freq: Four times a day (QID) | ORAL | Status: AC
  Administered 2020-10-08 – 2020-10-09 (×3): 1000 mg via ORAL
  Filled 2020-10-08 (×3): qty 2

## 2020-10-08 MED ORDER — ZOLPIDEM TARTRATE 5 MG PO TABS: 5.0000 mg | ORAL_TABLET | Freq: Every evening | ORAL | Status: DC | PRN

## 2020-10-08 MED ORDER — DIPHENHYDRAMINE HCL 25 MG PO CAPS: 25.0000 mg | ORAL_CAPSULE | ORAL | Status: DC | PRN

## 2020-10-08 MED ORDER — KETOROLAC TROMETHAMINE 30 MG/ML IJ SOLN: 30.0000 mg | Freq: Once | INTRAMUSCULAR | Status: DC

## 2020-10-08 MED ORDER — BUPIVACAINE IN DEXTROSE 0.75-8.25 % IT SOLN
INTRATHECAL | Status: DC | PRN
  Administered 2020-10-08: 1.6 mL via INTRATHECAL

## 2020-10-08 MED ORDER — KETOROLAC TROMETHAMINE 30 MG/ML IJ SOLN
30.0000 mg | Freq: Four times a day (QID) | INTRAMUSCULAR | Status: DC | PRN
  Administered 2020-10-08: 30 mg via INTRAVENOUS

## 2020-10-08 SURGICAL SUPPLY — 27 items
BACTOSHIELD CHG 4% 4OZ (MISCELLANEOUS) ×1
BARRIER ADHS 3X4 INTERCEED (GAUZE/BANDAGES/DRESSINGS) ×2 IMPLANT
CHLORAPREP W/TINT 26 (MISCELLANEOUS) ×2 IMPLANT
DRSG TELFA 3X8 NADH (GAUZE/BANDAGES/DRESSINGS) ×2 IMPLANT
ELECT CAUTERY BLADE 6.4 (BLADE) ×2 IMPLANT
ELECT REM PT RETURN 9FT ADLT (ELECTROSURGICAL) ×2
ELECTRODE REM PT RTRN 9FT ADLT (ELECTROSURGICAL) ×1 IMPLANT
GAUZE SPONGE 4X4 12PLY STRL (GAUZE/BANDAGES/DRESSINGS) ×2 IMPLANT
GLOVE SURG SYN 8.0 (GLOVE) ×2 IMPLANT
GOWN STRL REUS W/ TWL LRG LVL3 (GOWN DISPOSABLE) ×2 IMPLANT
GOWN STRL REUS W/ TWL XL LVL3 (GOWN DISPOSABLE) ×1 IMPLANT
GOWN STRL REUS W/TWL LRG LVL3 (GOWN DISPOSABLE) ×2
GOWN STRL REUS W/TWL XL LVL3 (GOWN DISPOSABLE) ×1
MANIFOLD NEPTUNE II (INSTRUMENTS) ×2 IMPLANT
MAT PREVALON FULL STRYKER (MISCELLANEOUS) ×2 IMPLANT
NEEDLE HYPO 22GX1.5 SAFETY (NEEDLE) ×2 IMPLANT
NS IRRIG 1000ML POUR BTL (IV SOLUTION) ×2 IMPLANT
PACK C SECTION AR (MISCELLANEOUS) ×2 IMPLANT
PAD OB MATERNITY 4.3X12.25 (PERSONAL CARE ITEMS) ×2 IMPLANT
PAD PREP 24X41 OB/GYN DISP (PERSONAL CARE ITEMS) ×2 IMPLANT
SCRUB CHG 4% DYNA-HEX 4OZ (MISCELLANEOUS) ×1 IMPLANT
STRAP SAFETY 5IN WIDE (MISCELLANEOUS) ×2 IMPLANT
SUT CHROMIC 1 CTX 36 (SUTURE) ×6 IMPLANT
SUT PLAIN GUT 0 (SUTURE) ×8 IMPLANT
SUT VIC AB 0 CT1 36 (SUTURE) ×4 IMPLANT
SYR 30ML LL (SYRINGE) ×4 IMPLANT
TAPE CLOTH 1/2 (GAUZE/BANDAGES/DRESSINGS) ×2 IMPLANT

## 2020-10-08 NOTE — Anesthesia Procedure Notes (Signed)
Spinal  Patient location during procedure: OR Start time: 10/08/2020 12:17 PM End time: 10/08/2020 12:19 PM Reason for block: surgical anesthesia Staffing Performed: resident/CRNA  Anesthesiologist: Alphonsus Sias, MD Resident/CRNA: Hedda Slade, CRNA Preanesthetic Checklist Completed: patient identified, IV checked, site marked, risks and benefits discussed, surgical consent, monitors and equipment checked, pre-op evaluation and timeout performed Spinal Block Patient position: sitting Prep: ChloraPrep Patient monitoring: heart rate, continuous pulse ox, blood pressure and cardiac monitor Approach: midline Location: L3-4 Injection technique: single-shot Needle Needle type: Whitacre and Introducer  Needle gauge: 24 G Needle length: 9 cm Assessment Events: CSF return Additional Notes Negative paresthesia. Negative blood return. Positive free-flowing CSF. Expiration date of kit checked and confirmed. Patient tolerated procedure well, without complications.

## 2020-10-08 NOTE — Discharge Summary (Signed)
Obstetrical Discharge Summary  Patient Name: Terry Ramos DOB: [birth year] MRN: [id number]  Date of Admission: 10/07/2020 Date of Delivery: 10/08/2020 Delivered by: Huel Cote MD Date of Discharge: 10/10/20  Primary OB: Terry Ramos  IFO:YDXAJOI'N last menstrual period was 02/17/2019. EDC Estimated Date of Delivery: 10/27/20 Gestational Age at Delivery: [redacted]w[redacted]d   Antepartum complications:  Chronic HTN in pregnancy Multiple sclerosis - new diagnosis Obesity in pregnancy Learning disability Social concerns with FOB Uterine fibroids   Admitting Diagnosis: IOL for [facility] Secondary Diagnosis: Patient Active Problem List   Diagnosis Date Noted   Acute blood loss anemia 10/10/2020   Postpartum hemorrhage 10/08/2020   Postoperative state 10/08/2020   Chronic hypertension affecting pregnancy 05/12/2020   Multiple sclerosis affecting pregnancy in third trimester ([place]) 05/12/2020   Learning disability 04/27/2020    Augmentation: Pitocin, Cytotec, and IP Foley Complications: [id number] Intrapartum complications/course: pt was admitted for induction at 37+1 weeks and on induction day 2 she elected for elective primary LTCS + BTL  Date of Delivery: 10/10/2020  Delivered By: Huel Cote MD Delivery Type: primary cesarean section, low transverse incision Anesthesia: spinal Placenta: manual Laceration: n/a Episiotomy: none Newborn Data:TOB 10/08/20 at 91 Live born female Birth Weight:  3020 gm APGAR: , 8/9  Newborn Delivery   Birth date/time:  Delivery type: C-Section, Low Transverse C-section categorization: Primary        Postpartum Procedures: none  Edinburgh:  Edinburgh Postnatal Depression Scale Screening Tool 10/09/2020 10/09/2020 10/08/2020  I have been able to laugh and see the funny side of things. 0 (No Data) (No Data)  I have looked forward with enjoyment to things. 0 - -  I have blamed myself unnecessarily when things went wrong. 2 - -  I have  been anxious or worried for no good reason. 2 - -  I have felt scared or panicky for no good reason. 2 - -  Things have been getting on top of me. 2 - -  I have been so unhappy that I have had difficulty sleeping. 1 - -  I have felt sad or miserable. 1 - -  I have been so unhappy that I have been crying. 1 - -  The thought of harming myself has occurred to me. 0 - -  Edinburgh Postnatal Depression Scale Total 11 - -    Post partum course: Consult with SSW  Patient had an uncomplicated postpartum course.  By time of discharge on POD#2, her pain was controlled on oral pain medications; she had appropriate lochia and was ambulating, voiding without difficulty, tolerating regular diet and passing flatus.   She was deemed stable for discharge to home.    Discharge Physical Exam:  BP 124/84 (BP Location: Left Arm)   Pulse 94   Temp 98.7 F (37.1 C) (Oral)   Resp 20   Ht 5\' 4"  (1.626 m)   Wt 92.1 kg   LMP 02/17/2019   SpO2 98%   Breastfeeding Unknown   BMI 34.84 kg/m   General: NAD CV: RRR Pulm: nl effort ABD: s/nd/nt, fundus firm and below the umbilicus Lochia: moderate Incision: c/d/I (with honeycomb dressing) DVT Evaluation: LE non-ttp, no evidence of DVT on exam.  Hemoglobin  Date Value Ref Range Status  10/10/2020 8.8 (L) 12.0 - 15.0 g/dL Final   HCT  Date Value Ref Range Status  10/10/2020 25.1 (L) 36.0 - 46.0 % Final     Disposition: stable, discharge to home. Baby Feeding: breastmilk Baby Disposition: home with mom  Rh Immune globulin given: n/a Rubella vaccine given: Immune Tdap vaccine given in AP or PP setting: 08/13/20 Flu vaccine given in AP or PP setting: 06/12/20  Contraception: BTL completed   Prenatal Labs:  Blood type/Rh O Pos  Antibody screen neg  Rubella Immune  Varicella Immune  RPR NR  HBsAg Neg  HIV NR  GC neg  Chlamydia neg  Genetic screening negative  1 hour GTT  135  3 hour GTT    GBS  Pos- clinda resistant, (PCN allergy)      Plan:  Terry Ramos was discharged to home in good condition. Follow-up appointment with delivering provider in 6 weeks.  Discharge Medications: Allergies as of 10/10/2020       Reactions   Penicillins Cough   Has tolerated amoxicillin in the past  Did it involve swelling of the face/tongue/throat, SOB, or low BP? No Did it involve sudden or severe rash/hives, skin peeling, or any reaction on the inside of your mouth or nose? No Did you need to seek medical attention at a hospital or doctor's office? No When did it last happen?       If all above answers are "NO", may proceed with cephalosporin use.        Medication List     STOP taking these medications    aspirin 81 MG chewable tablet       TAKE these medications    acetaminophen 500 MG tablet Commonly known as: TYLENOL Take 2 tablets (1,000 mg total) by mouth every 6 (six) hours as needed for moderate pain.   B-complex with vitamin C tablet Take 1 tablet by mouth daily.   cholecalciferol 25 MCG (1000 UNIT) tablet Commonly known as: VITAMIN D3 Take 1,000 Units by mouth daily.   ferrous sulfate 325 (65 FE) MG tablet Take 1 tablet (325 mg total) by mouth 2 (two) times daily with a meal.   ibuprofen 600 MG tablet Commonly known as: ADVIL Take 1 tablet (600 mg total) by mouth every 6 (six) hours as needed.   NIFEdipine 60 MG 24 hr tablet Commonly known as: PROCARDIA XL/NIFEDICAL XL Take 1 tablet (60 mg total) by mouth daily.   oxyCODONE 5 MG immediate release tablet Commonly known as: Oxy IR/ROXICODONE Take 1-2 tablets (5-10 mg total) by mouth every 4 (four) hours as needed for moderate pain.   prenatal multivitamin Tabs tablet Take 1 tablet by mouth daily at 12 noon.   senna-docusate 8.6-50 MG tablet Commonly known as: Senokot-S Take 2 tablets by mouth daily.   vitamin B-12 1000 MCG tablet Commonly known as: CYANOCOBALAMIN Take 5,000 mcg by mouth daily.         Follow-up [facility]. Schedule an appointment as soon as possible for a visit in 1 week(s).   Why: for a blood pressure check, you will also need a post-op appointment in 2 weeks. Contact information: [facility] [place] [phone]                Signed: Clydene Laming 10/10/2020 9:50 AM

## 2020-10-08 NOTE — Transfer of Care (Signed)
Immediate Anesthesia Transfer of Care Note  Patient: Terry Ramos  Procedure(s) Performed: CESAREAN SECTION  Patient Location: PACU  Anesthesia Type:Spinal  Level of Consciousness: awake, alert  and oriented  Airway & Oxygen Therapy: Patient Spontanous Breathing  Post-op Assessment: Report given to RN and Post -op Vital signs reviewed and stable  Post vital signs: Reviewed and stable  Last Vitals:  Vitals Value Taken Time  BP 91/50 10/08/20 1334  Temp 36.4 C 10/08/20 1332  Pulse 78 10/08/20 1336  Resp 21 10/08/20 1336  SpO2 99 % 10/08/20 1336  Vitals shown include unvalidated device data.  Last Pain:  Vitals:   10/08/20 1332  TempSrc: Oral  PainSc:       Patients Stated Pain Goal: 0 (40/81/44 8185)  Complications: No notable events documented.

## 2020-10-08 NOTE — Anesthesia Preprocedure Evaluation (Addendum)
Anesthesia Evaluation  Patient identified by MRN, date of birth, ID band Patient awake    Reviewed: Allergy & Precautions, H&P , NPO status , reviewed documented beta blocker date and time   Airway Mallampati: II  TM Distance: >3 FB Neck ROM: full    Dental  (+) Chipped   Pulmonary    Pulmonary exam normal        Cardiovascular hypertension, Normal cardiovascular exam     Neuro/Psych PSYCHIATRIC DISORDERS Nystagmus, thought 2 to MS    GI/Hepatic GERD  Controlled,  Endo/Other    Renal/GU      Musculoskeletal   Abdominal   Peds  Hematology   Anesthesia Other Findings Past Medical History: No date: Hypertension No date: Multiple sclerosis ([place])  Past Surgical History: No date: ABDOMINAL SURGERY  BMI    Body Mass Index: 34.84 kg/m      Reproductive/Obstetrics                            Anesthesia Physical Anesthesia Plan  ASA: 2  Anesthesia Plan: Spinal   Post-op Pain Management:    Induction: Intravenous  PONV Risk Score and Plan: Treatment may vary due to age or medical condition and TIVA  Airway Management Planned: Nasal Cannula and Natural Airway  Additional Equipment:   Intra-op Plan:   Post-operative Plan:   Informed Consent: I have reviewed the patients History and Physical, chart, labs and discussed the procedure including the risks, benefits and alternatives for the proposed anesthesia with the patient or authorized representative who has indicated his/her understanding and acceptance.     Dental Advisory Given  Plan Discussed with: CRNA  Anesthesia Plan Comments:         Anesthesia Quick Evaluation

## 2020-10-08 NOTE — Progress Notes (Signed)
Patient ID: Terry Ramos, female   DOB: 20-Sep-1985, 35 y.o.   MRN: [id number] Pt is day #2 induction for [facility]  Cytotec , foley bulb , pitocin .  Overnight she elected for stopping induction and proceeding to primary elective c/s .  She also is interested in permanent sterilization . Reconfirms today   The procedure and risks involved have been thoroughly explained . Mother in room as well . All questions have been answered. NPO . Scheduled  LTCS and BTL . At 1200 today .

## 2020-10-08 NOTE — Op Note (Signed)
NAME: Terry Ramos, [facility] MEDICAL RECORD NO: [id number] ACCOUNT NO: [id number] DATE OF BIRTH: 04/14/1985 FACILITY: [facility] LOCATION: [facility] PHYSICIAN: Boykin Nearing, MD  Operative Report   DATE OF PROCEDURE: 10/08/2020  PREOPERATIVE DIAGNOSES:   1.  Elective primary cesarean section. 2.  Elective sterilization. 3.  Chronic hypertension, on medications. 4.  37+2 weeks estimated gestational age.  POSTOPERATIVE DIAGNOSES:   1.  Elective primary cesarean section. 2.  Elective sterilization. 3.  Chronic hypertension, on medications. 4.  37+2 weeks estimated gestational age. 5.  Vigorous female, delivered.  PROCEDURES: 1.  Primary low transverse cesarean section, elective. 2.  Elective permanent sterilization.  ANESTHESIA:  Spinal.  SURGEON:  Boykin Nearing, MD  FIRST ASSISTANT:  Roby Lofts, certified nurse midwife.  SECOND ASSISTANT:  Avelino Leeds, certified nurse midwife.  INDICATIONS:  A 35 year old gravida 1, para 0, patient at 82+2 weeks' estimated gestational age, admitted the day previously for an induction of labor due to chronic hypertension on medications.  The patient labored with induction agents and did not  progress past 1 cm and she independently decided that she would like to have a primary cesarean section and permanent sterilization.  She had decided on sterilization greater than 30 days ago.  Medicaid consent signed at that time.  DESCRIPTION OF PROCEDURE:  After adequate spinal anesthesia, the patient was placed in dorsal supine position with hip roll on the right side.  The patient's abdomen was prepped and draped in normal sterile fashion.  Two grams IV Ancef was administered  for surgical prophylaxis.  Timeout was performed.  A Pfannenstiel incision was made 2 fingerbreadths above the symphysis pubis.  Sharp dissection was used to identify the fascia.  The fascia was opened in the midline and then in a transverse fashion.   The superior  aspect of the fascia was grasped with Kocher clamps and the recti muscles were dissected free.  The inferior aspect of the fascia was grasped with Kocher clamps and pyramidalis muscles dissected free.  Entry into the peritoneal cavity was  accomplished sharply.  Vesicouterine peritoneal fold was identified and a bladder flap was created and the bladder was reflected inferiorly.  Low transverse uterine incision was made upon entry into the endometrial cavity, clear fluid resulted.  Fetal  head was brought to the incision and a Kiwi bell vacuum was applied to the occiput and with one gentle pull the head was delivered and the vacuum was removed.  Shoulders and body were delivered without difficulty.  Vigorous female was then dried on the  mother's abdomen for 60 seconds with delayed cord clamping and then the vigorous female was passed to nursery staff who assigned Apgar scores of 8 and 9, fetal weight 3020 grams.  Time of delivery was 12:47 p.m.  The placenta was then manually delivered  and the uterus was exteriorized.  The uterus was globular and generous in size consistent with a prior history of fibroids, although these seemed to be intramural, difficult to definitively diagnose.  The uterine incision was then closed with #1 chromic  suture in a running locking fashion with additional figure-of-eight sutures used for hemostasis.  Good hemostasis was noted.  Attention was directed to the patient's right fallopian tube, which was grasped in the midportion with the Babcock clamp.  Two  separate 0 plain gut sutures were placed on the fallopian tube and a 1.5 cm portion of fallopian tube was removed.  Good hemostasis was noted.  Similar procedure was repeated on the patient's left  fallopian tube again at the mid portion of the fallopian  tube 2 separate 0 plain gut sutures were placed and a 1.5 cm portion of fallopian tube was removed.  Good hemostasis was noted.  Posterior cul-de-sac was irrigated and  suctioned for hemostasis and the uterus was placed back into the abdominal cavity.   Pericolic gutters were wiped clean with a laparotomy tape and each tubal ligation site appeared hemostatic.  Uterine incision appeared hemostatic and Interceed was placed over the uterine incision in T-shape fashion.  The fascia was then closed with 1-0  Vicryl suture in a running nonlocking fashion.  Good approximation of edges and good hemostasis was noted.  The fascial edges were then injected with a solution of 20 mL of 1.3% Exparel plus 30 mL of 0.5% Marcaine plus 50 mL normal saline.  Approximately  50 mL of solution was injected at the fascial edges.  Subcutaneous tissues were irrigated and bovied for hemostasis and the skin was reapproximated with staples, Insorb absorbable staples with good cosmetic effect.  Additional 30 mL of Exparel solution  was injected beneath the skin.  There were no complications.  ESTIMATED BLOOD LOSS:  1125 mL.  URINE OUTPUT:  100 mL.  INTRAOPERATIVE FLUIDS:  1000 mL.  The patient tolerated the procedure well and was taken to recovery room in good condition.   PUS D: 10/08/2020 1:28:04 pm T: 10/08/2020 5:19:00 pm  JOB: [id number]/ [id number]

## 2020-10-09 LAB — CBC
HCT: 24.4 % — ABNORMAL LOW (ref 36.0–46.0)
Hemoglobin: 8.6 g/dL — ABNORMAL LOW (ref 12.0–15.0)
MCH: 31.7 pg (ref 26.0–34.0)
MCHC: 35.2 g/dL (ref 30.0–36.0)
MCV: 90 fL (ref 80.0–100.0)
Platelets: 367 10*3/uL (ref 150–400)
RBC: 2.71 MIL/uL — ABNORMAL LOW (ref 3.87–5.11)
RDW: 13.4 % (ref 11.5–15.5)
WBC: 19.8 10*3/uL — ABNORMAL HIGH (ref 4.0–10.5)
nRBC: 0 % (ref 0.0–0.2)

## 2020-10-09 MED ORDER — ACETAMINOPHEN 500 MG PO TABS
1000.0000 mg | ORAL_TABLET | Freq: Four times a day (QID) | ORAL | Status: DC | PRN
  Administered 2020-10-09 – 2020-10-10 (×3): 1000 mg via ORAL
  Filled 2020-10-09 (×3): qty 2

## 2020-10-09 NOTE — Progress Notes (Signed)
   10/09/20 1000  Clinical Encounter Type  Visited With Patient and family together  Visit Type Initial;Spiritual support  Referral From Nurse  Consult/Referral To [facility] visited room 349 Pt, [place]. Pt's mother was at her bedside. Pt had a C-Section and delivered a baby girl. Pt and her newborn is doing well. I engaged with the Pt's mother also who is very proud of her first grandchild. I provided reflective listening, spiritual and emotional support followed by a prayer for baby and family.

## 2020-10-09 NOTE — Anesthesia Postprocedure Evaluation (Signed)
Anesthesia Post Note  Patient: Terry Ramos  Procedure(s) Performed: [place]  Patient location during evaluation: Mother Baby Anesthesia Type: Spinal Level of consciousness: oriented and awake and alert Pain management: pain level controlled Vital Signs Assessment: post-procedure vital signs reviewed and stable Respiratory status: spontaneous breathing and respiratory function stable Cardiovascular status: blood pressure returned to baseline and stable Postop Assessment: no headache, no backache, no apparent nausea or vomiting and able to ambulate Anesthetic complications: no   No notable events documented.   Last Vitals:  Vitals:   10/09/20 0748 10/09/20 0750  BP: 113/85   Pulse: (!) 119 (!) 110  Resp: 17   Temp: 36.7 C   SpO2: 99%     Last Pain:  Vitals:   10/09/20 1000  TempSrc:   PainSc: 0-No pain                 Estill Batten

## 2020-10-09 NOTE — Progress Notes (Signed)
Post Partum Day/POD 1 Subjective: Doing well, no complaints.  Tolerating regular diet, pain with PO meds, voiding and ambulating without difficulty. Having gas pain, but ambulating in room.   No CP SOB Fever,Chills, N/V or leg pain; denies nipple or breast pain, no HA change of vision, RUQ/epigastric pain  Objective: BP 116/60 (BP Location: Right Arm)   Pulse (!) 120 Comment: nurse Emily notified  Temp 98.1 F (36.7 C) (Oral)   Resp 20   Ht 5\' 4"  (1.626 m)   Wt 92.1 kg   LMP 02/17/2019   SpO2 99%   Breastfeeding Unknown   BMI 34.84 kg/m    Physical Exam:  General: NAD Breasts: soft/nontender CV: RRR Pulm: nl effort, CTABL Abdomen: soft, NT, BS x 4 Incision: incision well approximated, no erythema or drainage; nursing in room to apply honeycomb dsg.  Lochia: small Uterine Fundus: fundus firm and 1 fb below umbilicus DVT Evaluation: no cords, ttp LEs   Recent Labs    10/08/20 0848 10/09/20 0714  HGB 10.2* 8.6*  HCT 29.0* 24.4*  WBC 18.9* 19.8*  PLT 400 367    Assessment/Plan: 35 y.o. G1P1001 postpartum day # 1  - Continue routine PP care - Lactation consult prn - BTL done with LTCS  - Acute blood loss anemia - hemodynamically stable and asymptomatic; start po ferrous sulfate BID with stool softeners  - Immunization status: all Imms up to date - pending Social work consult for maternal learning disability.    Disposition: Does not desire Dc home today.     Francetta Found, CNM 10/09/2020  2:50 PM

## 2020-10-09 NOTE — Anesthesia Post-op Follow-up Note (Signed)
  Anesthesia Pain Follow-up Note  Patient: Vanesa Renier  Day #: 1  Date of Follow-up: 10/09/2020 Time: 11:20 AM  Last Vitals:  Vitals:   10/09/20 0748 10/09/20 0750  BP: 113/85   Pulse: (!) 119 (!) 110  Resp: 17   Temp: 36.7 C   SpO2: 99%     Level of Consciousness: alert  Pain: mild   Side Effects:None  Catheter Site Exam:clean  Anti-Coag Meds (From admission, onward)   Start     Dose/Rate Route Frequency Ordered Stop   10/09/20 1300  enoxaparin (LOVENOX) injection 40 mg        40 mg Subcutaneous Every 24 hours 10/08/20 1345         Plan: D/C from anesthesia care at surgeon's request  Saquan Furtick,  Clearnce Sorrel

## 2020-10-10 DIAGNOSIS — D62 Acute posthemorrhagic anemia: Secondary | ICD-10-CM | POA: Diagnosis not present

## 2020-10-10 LAB — CBC WITH DIFFERENTIAL/PLATELET
Abs Immature Granulocytes: 0.12 10*3/uL — ABNORMAL HIGH (ref 0.00–0.07)
Basophils Absolute: 0 10*3/uL (ref 0.0–0.1)
Basophils Relative: 0 %
Eosinophils Absolute: 0.2 10*3/uL (ref 0.0–0.5)
Eosinophils Relative: 1 %
HCT: 25.1 % — ABNORMAL LOW (ref 36.0–46.0)
Hemoglobin: 8.8 g/dL — ABNORMAL LOW (ref 12.0–15.0)
Immature Granulocytes: 1 %
Lymphocytes Relative: 13 %
Lymphs Abs: 2.6 10*3/uL (ref 0.7–4.0)
MCH: 32.2 pg (ref 26.0–34.0)
MCHC: 35.1 g/dL (ref 30.0–36.0)
MCV: 91.9 fL (ref 80.0–100.0)
Monocytes Absolute: 2.1 10*3/uL — ABNORMAL HIGH (ref 0.1–1.0)
Monocytes Relative: 11 %
Neutro Abs: 14.1 10*3/uL — ABNORMAL HIGH (ref 1.7–7.7)
Neutrophils Relative %: 74 %
Platelets: 377 10*3/uL (ref 150–400)
RBC: 2.73 MIL/uL — ABNORMAL LOW (ref 3.87–5.11)
RDW: 13.6 % (ref 11.5–15.5)
WBC: 19.2 10*3/uL — ABNORMAL HIGH (ref 4.0–10.5)
nRBC: 0 % (ref 0.0–0.2)

## 2020-10-10 MED ORDER — IBUPROFEN 600 MG PO TABS: 600.0000 mg | ORAL_TABLET | Freq: Four times a day (QID) | ORAL | Status: AC | PRN

## 2020-10-10 MED ORDER — OXYCODONE HCL 5 MG PO TABS: 5.0000 mg | ORAL_TABLET | ORAL | 0 refills | Status: AC | PRN

## 2020-10-10 MED ORDER — NIFEDIPINE ER OSMOTIC RELEASE 60 MG PO TB24: 60.0000 mg | ORAL_TABLET | Freq: Every day | ORAL | 6 refills | Status: AC

## 2020-10-10 MED ORDER — SENNOSIDES-DOCUSATE SODIUM 8.6-50 MG PO TABS: 2.0000 | ORAL_TABLET | Freq: Every day | ORAL | Status: AC

## 2020-10-10 MED ORDER — ACETAMINOPHEN 500 MG PO TABS: 1000.0000 mg | ORAL_TABLET | Freq: Four times a day (QID) | ORAL | Status: AC | PRN

## 2020-10-10 MED ORDER — FERROUS SULFATE 325 (65 FE) MG PO TABS: 325.0000 mg | ORAL_TABLET | Freq: Two times a day (BID) | ORAL | Status: AC

## 2020-10-10 NOTE — TOC Initial Note (Signed)
Transition of Care [facility]) - Initial/Assessment Note    Patient Details  Name: Terry Ramos MRN: [id number] Date of Birth: [birth year]  Transition of Care [facility]) CM/SW Contact:    Terry Ramos, [place] Phone Number: [phone] 10/10/2020, 11:28 AM  Clinical Narrative:                  CSW received consult due to score 11 on Edinburgh Depression Screen.    This Education officer, museum met with patient and her mother at bedside with patient's permission. Patient very friendly, attentive to newborn. Patient openly discussed initial challenges with her pregnancy and conflicts with FOB which caused some stress and frustration, however is feeling more optimistic now. Patient describes having a strong support system  and will be residing with her mother, father and sister upon discharged. Patient will apply for [facility] and is also covered under Medicaid. Patient and her mother confirm having  all items needed for the baby and is looking forward to discharge today.   CSW provided education regarding Baby Blues vs PMADs and provided MOB with resources for mental health follow up.  CSW encouraged MOB to evaluate her mental health throughout the postpartum period with the use of the New Mom Checklist developed by Postpartum Progress as well as the [place] Postnatal Depression Scale and notify a medical professional if symptoms arise.     Expected Discharge Plan: Home/Self Care Barriers to Discharge: No Barriers Identified   Patient Goals and CMS Choice Patient states their goals for this hospitalization and ongoing recovery are:: To return home      Expected Discharge Plan and Services Expected Discharge Plan: Home/Self Care In-house Referral: Clinical Social Work       Expected Discharge Date: 10/10/20                                    Prior Living Arrangements/Services   Lives with:: Parents, Siblings Patient language and need for interpreter reviewed:: Yes Do you feel safe going  back to the place where you live?: Yes      Need for Family Participation in Patient Care: Yes (Comment) Care giver support system in place?: Yes (comment)   Criminal Activity/Legal Involvement Pertinent to Current Situation/Hospitalization: No - Comment as needed  Activities of Daily Living Home Assistive Devices/Equipment: None ADL Screening (condition at time of admission) Patient's cognitive ability adequate to safely complete daily activities?: Yes Is the patient deaf or have difficulty hearing?: No Does the patient have difficulty seeing, even when wearing glasses/contacts?: No Does the patient have difficulty concentrating, remembering, or making decisions?: Yes Patient able to express need for assistance with ADLs?: Yes Does the patient have difficulty dressing or bathing?: No Independently performs ADLs?: Yes (appropriate for developmental age) Communication: Needs assistance, Independent Is this a change from baseline?: Pre-admission baseline Dressing (OT): Independent Grooming: Independent Feeding: Independent Bathing: Independent Toileting: Independent In/Out Bed: Independent Walks in Home: Independent Does the patient have difficulty walking or climbing stairs?: No Weakness of Legs: None Weakness of Arms/Hands: None  Permission Sought/Granted   Permission granted to share information with : Yes, Verbal Permission Granted        Permission granted to share info w Relationship: Terry Ramos-mother  Permission granted to share info w Contact Information: [phone]  Emotional Assessment Appearance:: Appears stated age Attitude/Demeanor/Rapport: Engaged Affect (typically observed): Accepting, Adaptable Orientation: : Oriented to Self, Oriented to Place, Oriented to  Time, Oriented  to Situation Alcohol / Substance Use: Not Applicable Psych Involvement: No (comment)  Admission diagnosis:  Chronic hypertension affecting pregnancy [O10.919] Postoperative state  [Z98.890] Patient Active Problem List   Diagnosis Date Noted   Acute blood loss anemia 10/10/2020   Postpartum hemorrhage 10/08/2020   Postoperative state 10/08/2020   Chronic hypertension affecting pregnancy 05/12/2020   Multiple sclerosis affecting pregnancy in third trimester ([place]) 05/12/2020   Learning disability 04/27/2020   PCP:  Minda Meo, CNM Pharmacy:   [id number] [address], [place] [address] Phone: [phone] Fax: [phone]     Social Determinants of Health (SDOH) Interventions    Readmission Risk Interventions No flowsheet data found.

## 2020-10-10 NOTE — Progress Notes (Signed)
Pt discharged with infant.  Discharge instructions, prescriptions and follow up appointment given to and reviewed with pt and her Mother. Pt and Mother verbalized understanding. Escorted out by auxillary.

## 2020-10-13 LAB — SURGICAL PATHOLOGY

## 2021-08-09 ENCOUNTER — Other Ambulatory Visit: Payer: Self-pay | Admitting: Family Medicine

## 2021-08-09 DIAGNOSIS — G939 Disorder of brain, unspecified: Secondary | ICD-10-CM

## 2021-08-10 ENCOUNTER — Other Ambulatory Visit: Payer: Self-pay | Admitting: Family Medicine

## 2021-08-10 DIAGNOSIS — G939 Disorder of brain, unspecified: Secondary | ICD-10-CM

## 2021-08-19 ENCOUNTER — Other Ambulatory Visit: Payer: Managed Care, Other (non HMO)

## 2021-08-19 ENCOUNTER — Inpatient Hospital Stay: Admission: RE | Admit: 2021-08-19 | Payer: Managed Care, Other (non HMO) | Source: Ambulatory Visit

## 2021-08-26 ENCOUNTER — Ambulatory Visit
Admission: RE | Admit: 2021-08-26 | Discharge: 2021-08-26 | Disposition: A | Discharge status: Home / Self Care | Payer: Commercial Managed Care - HMO | Source: Ambulatory Visit | Attending: Family Medicine | Admitting: Family Medicine

## 2021-08-26 DIAGNOSIS — G939 Disorder of brain, unspecified: Secondary | ICD-10-CM

## 2021-08-26 IMAGING — MR MR CERVICAL SPINE W/O CM
4 of 5 series · 27 of 48 positions shown · non-contrast
Comparison: None Available.

CLINICAL DATA: Follow-up [VT] MRI that showed white matter lesions

EXAM:
MRI CERVICAL SPINE WITHOUT CONTRAST
TECHNIQUE: Multiplanar, multisequence MR imaging of the cervical spine was
performed. No intravenous contrast was administered.

[Series 6: T1 · sagittal · 3.0mm · 0.66mm/px · 6 of 15 slices shown]
[im 1/15]
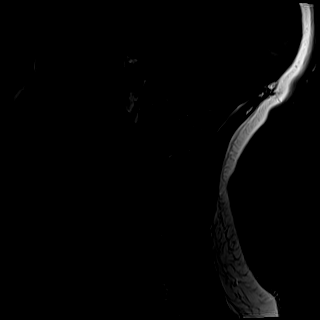
[im 3/15]
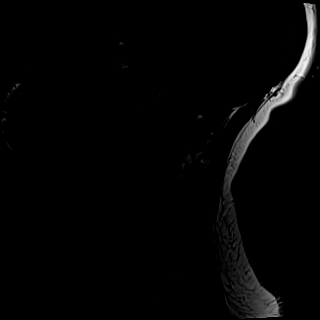
[im 6/15]
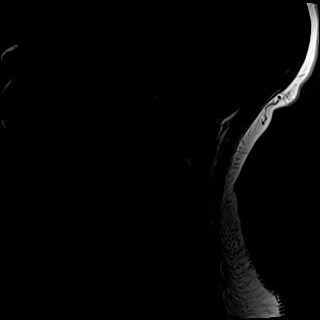
[im 9/15]
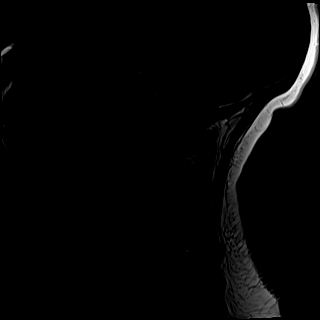
[im 12/15]
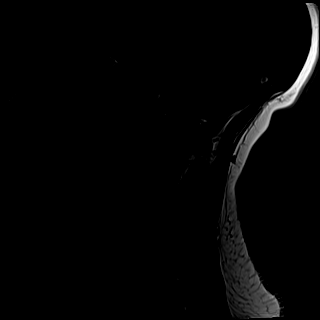
[im 15/15]
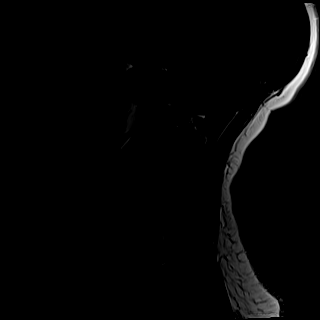

[Series 7: T2 · sagittal · 3.0mm · 0.55mm/px · 7 of 15 slices shown (1 of 2)]
[im 1/15]
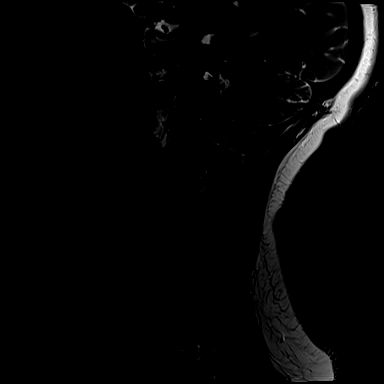
[im 3/15]
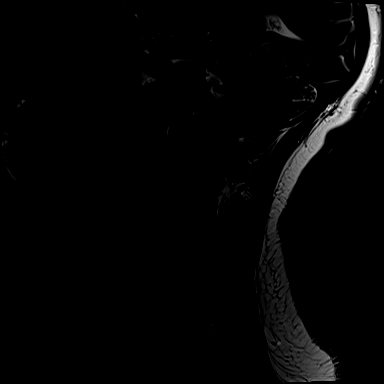
[im 5/15]
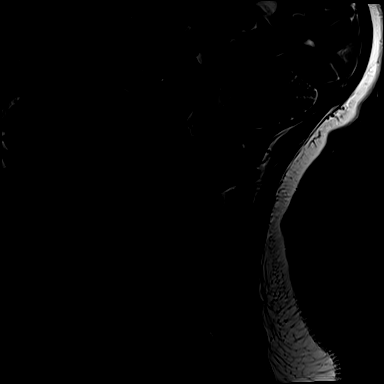
[im 8/15]
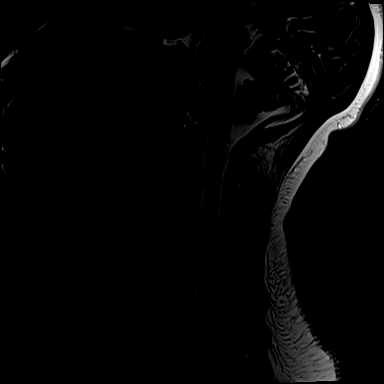
[im 10/15]
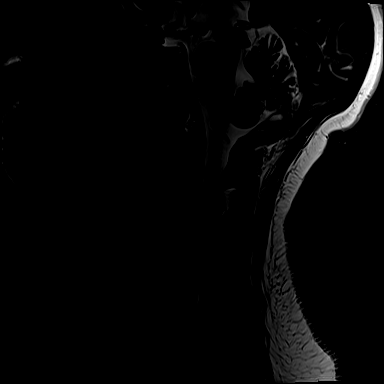
[im 12/15]
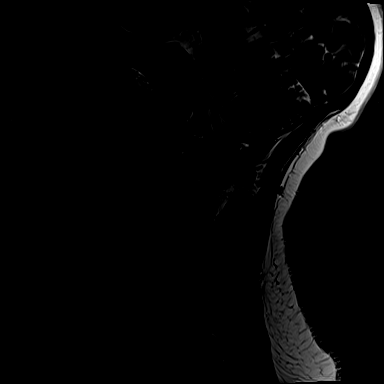
[im 15/15]
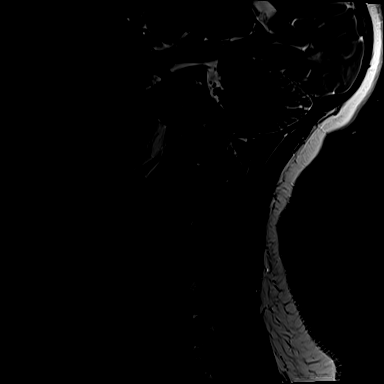

[Series 8: STIR · sagittal · 3.0mm · 0.33mm/px · 6 of 15 slices shown]
[im 1/15]
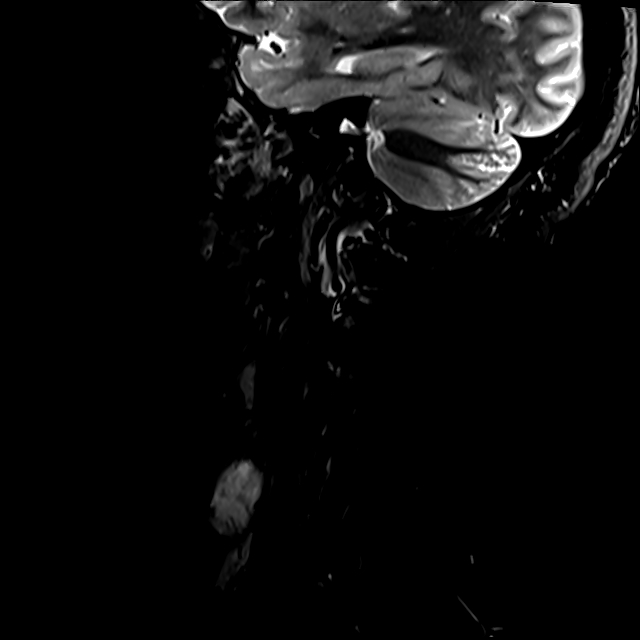
[im 3/15]
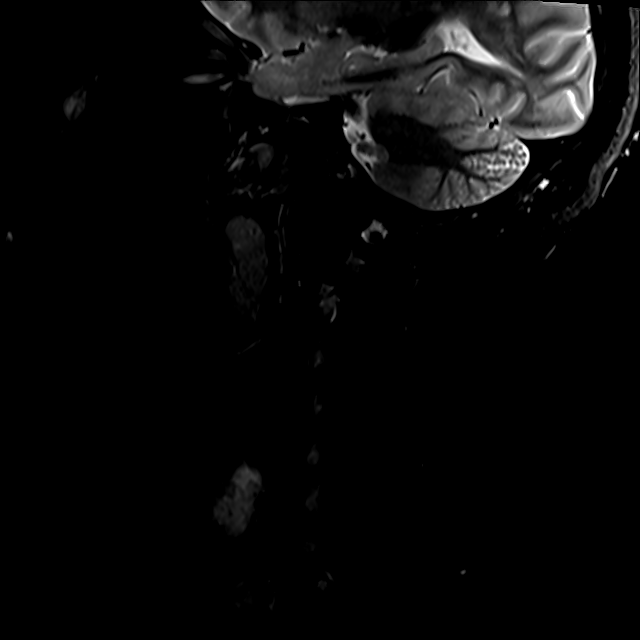
[im 5/15]
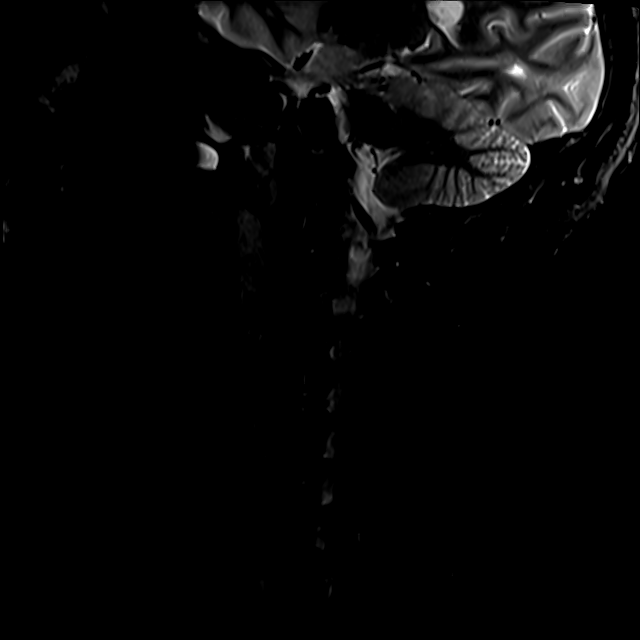
[im 8/15]
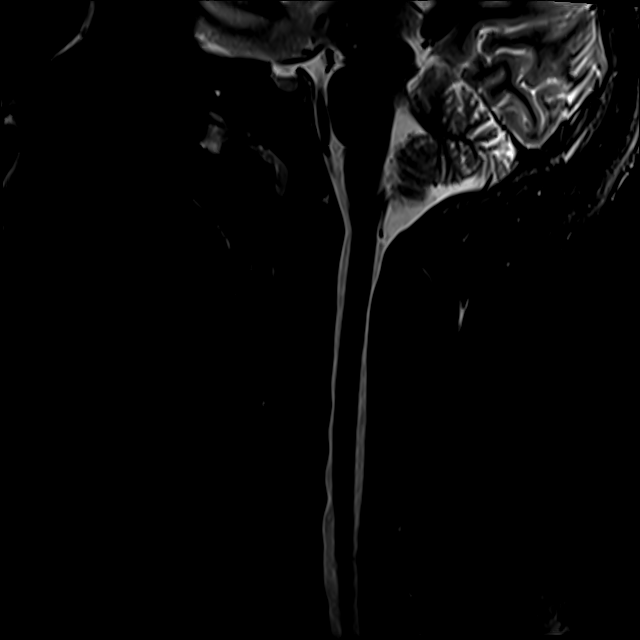
[im 10/15]
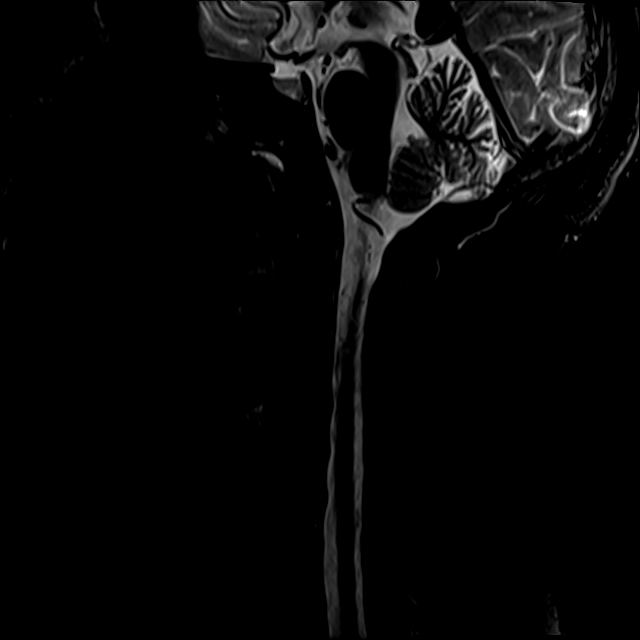
[im 12/15]
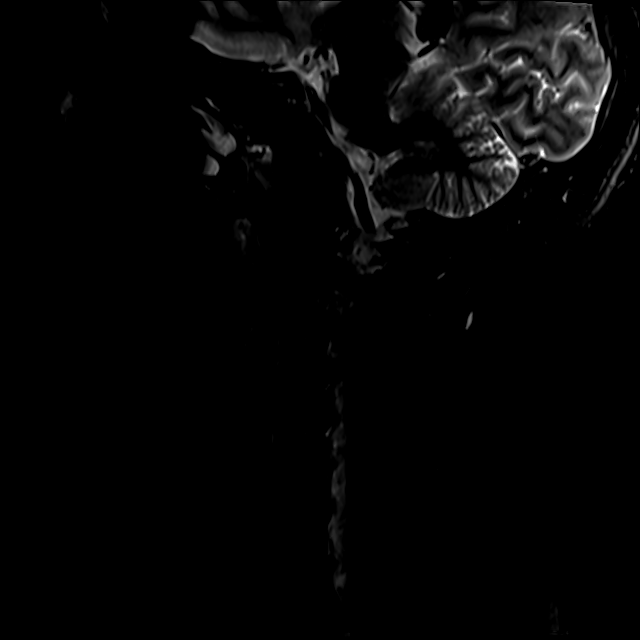

[Series 9: T2 · axial · 3.0mm · 0.50mm/px · z∈[-65,+33]mm · 8 of 32 slices shown (2 of 2)]
[im 1/32]
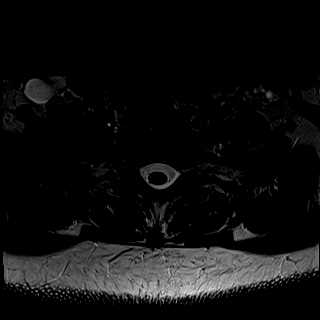
[im 5/32]
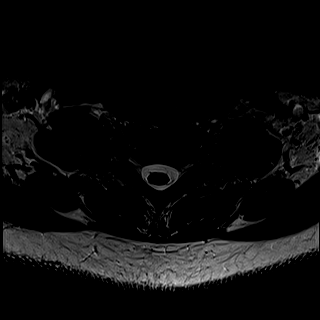
[im 10/32]
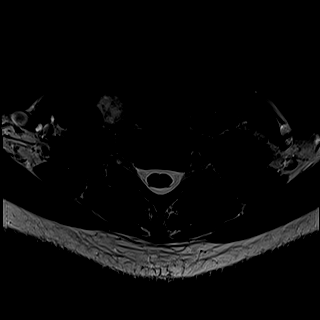
[im 15/32]
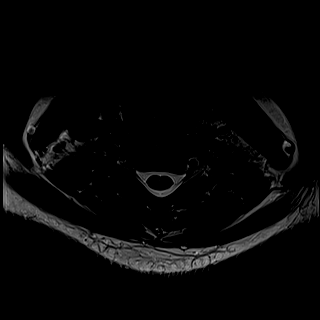
[im 17/32]
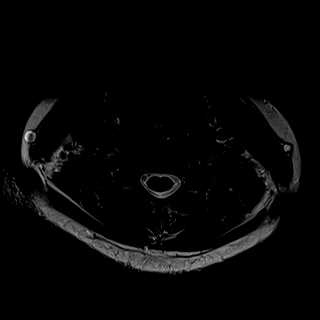
[im 22/32]
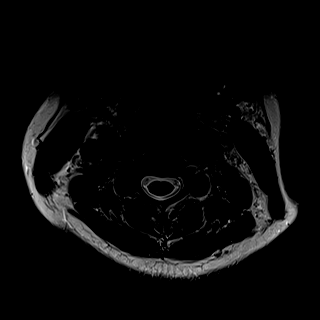
[im 27/32]
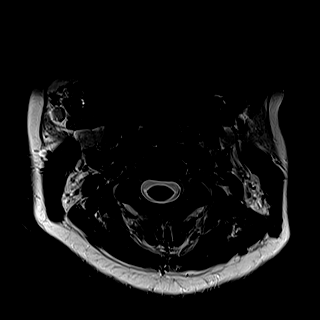
[im 32/32]
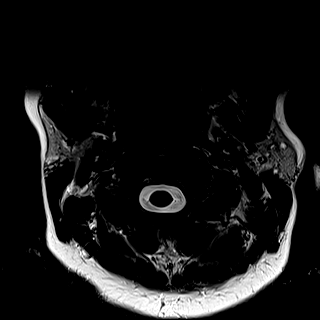

[27 of 48 positions shown; findings below may reference images not displayed]

FINDINGS: Alignment: There is straightening of the normal cervical lordosis.
There is no antero or retrolisthesis.

Vertebrae: Vertebral body heights are preserved. Background marrow
signal is normal. There is T1 and T2 hyperintensity along the antero
inferior C5 vertebral body without suspicious features. There is
ill-defined T1 and T2 hyperintensity in the C7 vertebral body most
likely reflecting benign intraosseous hemangioma. There is no
suspicious marrow signal abnormality or evidence of acute injury.

Cord: There is patchy signal abnormality in the left aspect of the
cord at C3 (9-9). The cord is otherwise normal in signal and
morphology.

Posterior Fossa, vertebral arteries, paraspinal tissues: The
posterior fossa is assessed on the separately dictated brain MRI.
The vertebral artery flow voids are normal. There is a 1.7 cm right
thyroid nodule. The paraspinal soft tissues are unremarkable.

Disc levels:

The disc heights are preserved. There is no significant spinal canal
or neural foraminal stenosis.
IMPRESSION: 1. Small focus of signal abnormality in the left aspect of the cord
at C3 most likely reflecting a demyelinating lesion. No other
lesions.
2. 1.7 cm right thyroid nodule. Recommend thyroid ultrasound for
further evaluation.

## 2021-08-26 IMAGING — MR MR HEAD WO/W CM
14 series · 48 of 48 positions shown · IV contrast (multihance)
Comparison: Brain MRI [DATE]

CLINICAL DATA: Follow-up scan in [KF] demonstrating white matter
lesions concerning for multiple sclerosis

EXAM:
MRI HEAD WITHOUT AND WITH CONTRAST
TECHNIQUE: Multiplanar, multiecho pulse sequences of the brain and surrounding
structures were obtained without and with intravenous contrast.
CONTRAST:  19mL MULTIHANCE GADOBENATE DIMEGLUMINE 529 MG/ML IV SOLN

[Series 5: T1 · sagittal · 4.0mm · 0.75mm/px · 2 of 31 slices shown (1 of 3)]
[im 1/31]
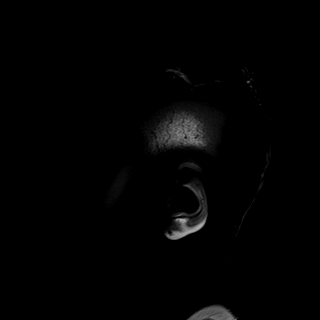
[im 31/31]
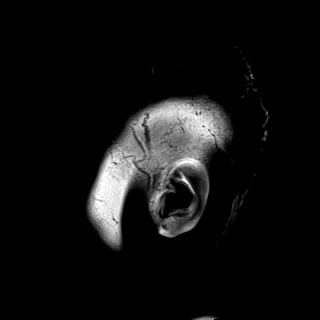

[Series 6: DWI · axial · 3.0mm · 0.94mm/px · z∈[-68,+75]mm · 8 of 164 slices shown (1 of 3)]
[im 1/164]
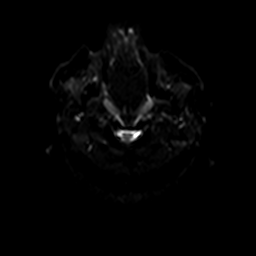
[im 24/164]
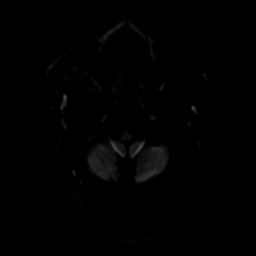
[im 47/164]
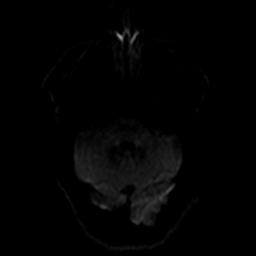
[im 70/164]
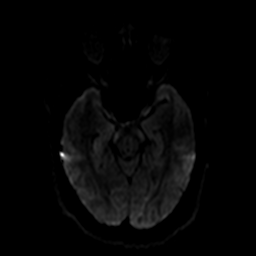
[im 94/164]
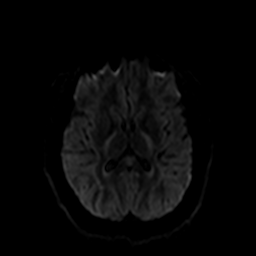
[im 117/164]
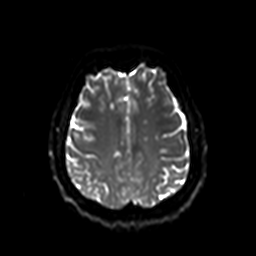
[im 140/164]
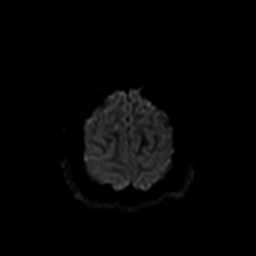
[im 164/164]
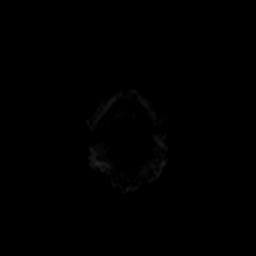

[Series 7: ax dwi_tracew · axial · 3.0mm · 0.94mm/px · z∈[-68,+75]mm · 4 of 82 slices shown]
[im 1/82]
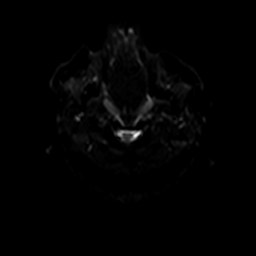
[im 28/82]
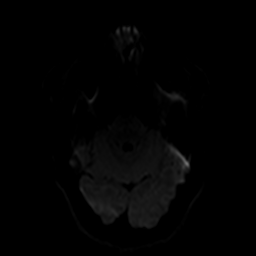
[im 55/82]
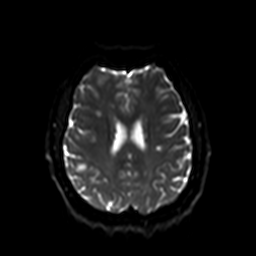
[im 82/82]
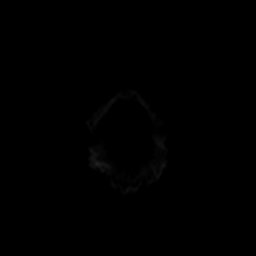

[Series 8: ax dwi_adc · axial · 3.0mm · 0.94mm/px · z∈[-68,+75]mm · 2 of 41 slices shown]
[im 1/41]
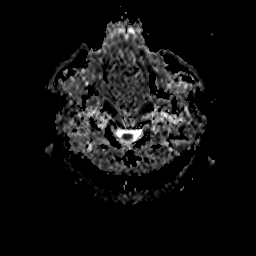
[im 41/41]
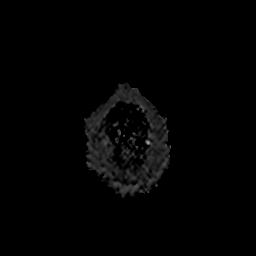

[Series 9: DWI · coronal · 5.0mm · 1.44mm/px · 3 of 64 slices shown (2 of 3)]
[im 1/64]
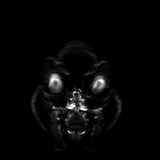
[im 32/64]
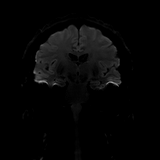
[im 64/64]
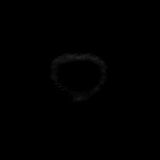

[Series 10: DWI · coronal · 5.0mm · 1.44mm/px · 2 of 32 slices shown (3 of 3)]
[im 1/32]
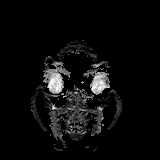
[im 32/32]
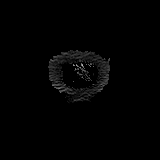

[Series 11: T2 · axial · 4.0mm · 0.36mm/px · z∈[-70,+79]mm · 2 of 30 slices shown]
[im 1/30]
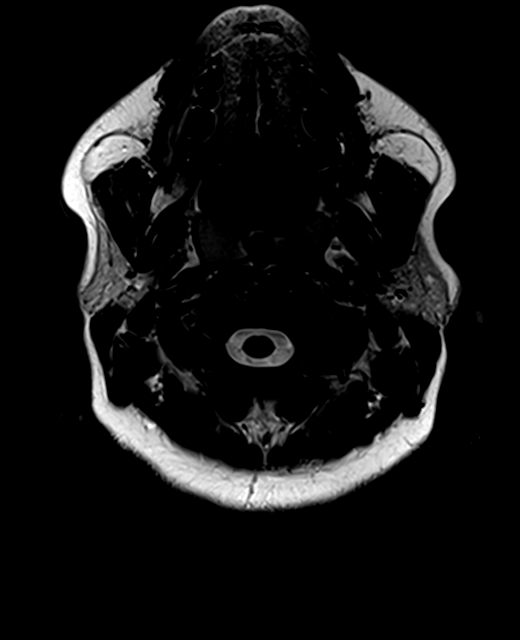
[im 30/30]
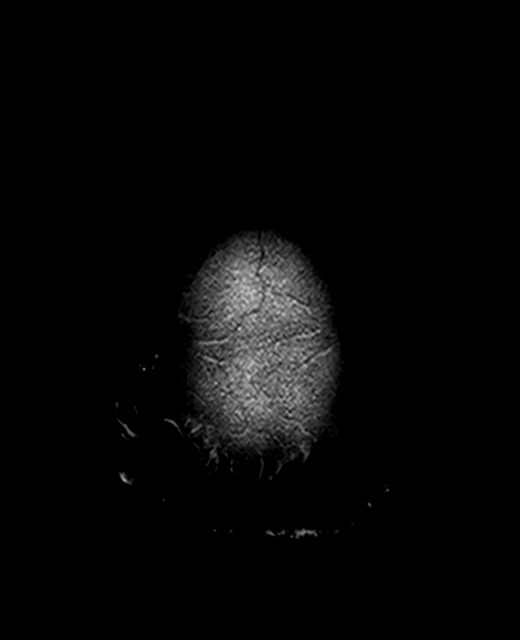

[Series 12: FLAIR · axial · 3.0mm · 0.72mm/px · 1 of 26 slices shown (1 of 2)]
[im 1/26]
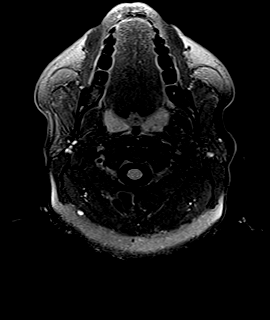

[Series 14: swi_images · axial · 3.0mm · 0.90mm/px · z∈[-71,+80]mm · 3 of 52 slices shown]
[im 1/52]
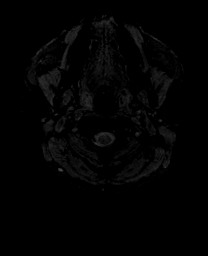
[im 26/52]
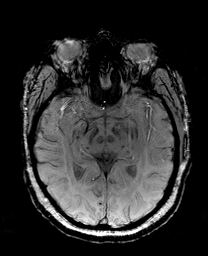
[im 52/52]
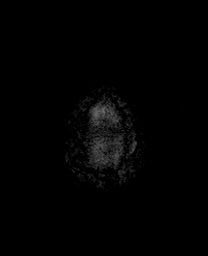

[Series 15: T1 · axial · 1.0mm · 0.90mm/px · z∈[-76,+81]mm · 8 of 160 slices shown (2 of 3)]
[im 1/160]
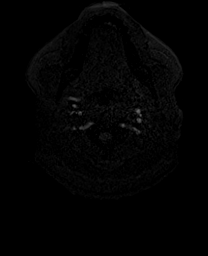
[im 23/160]
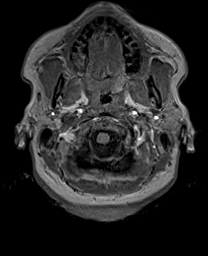
[im 46/160]
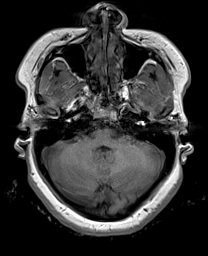
[im 69/160]
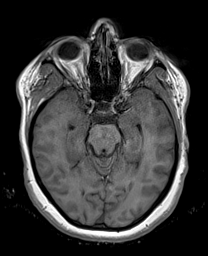
[im 91/160]
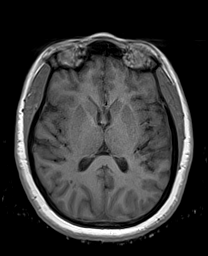
[im 114/160]
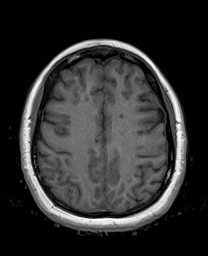
[im 137/160]
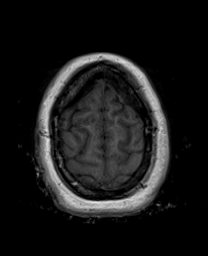
[im 160/160]
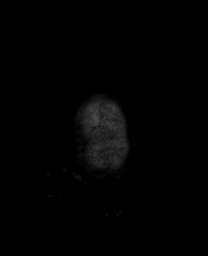

[Series 16: FLAIR · sagittal · 4.0mm · 0.72mm/px · 1 of 28 slices shown (2 of 2)]
[im 1/28]
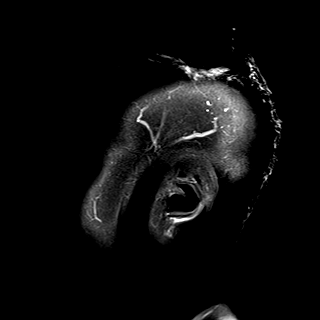

[Series 17: T2 post-contrast · coronal · 4.5mm · 0.36mm/px · 2 of 30 slices shown]
[im 1/30]
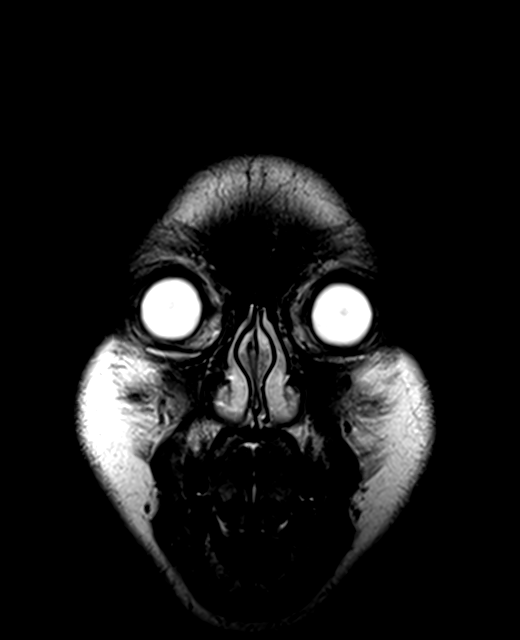
[im 30/30]
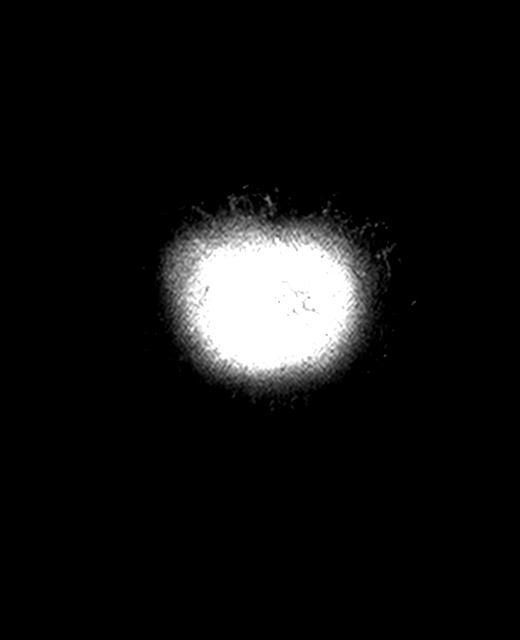

[Series 18: T1 · axial · 1.0mm · 0.90mm/px · z∈[-76,+81]mm · 8 of 160 slices shown (3 of 3)]
[im 1/160]
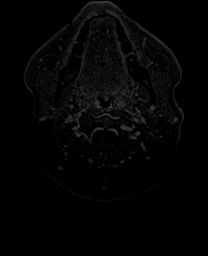
[im 23/160]
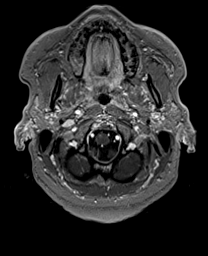
[im 46/160]
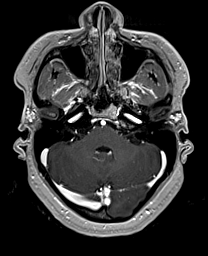
[im 69/160]
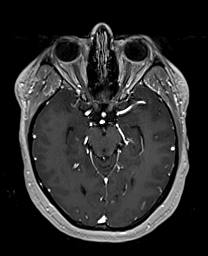
[im 91/160]
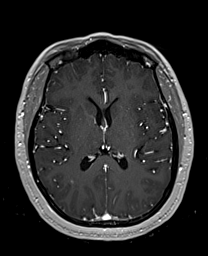
[im 114/160]
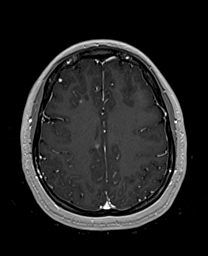
[im 137/160]
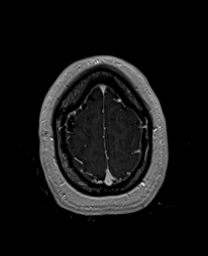
[im 160/160]
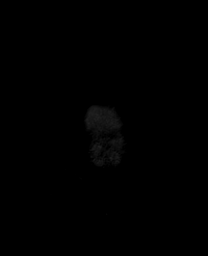

[Series 19: T1 post-contrast · coronal · 4.5mm · 0.72mm/px · 2 of 30 slices shown]
[im 1/30]
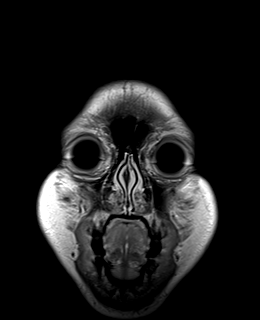
[im 30/30]
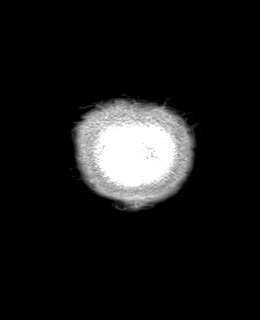

[48 of 48 positions shown; findings below may reference images not displayed]

FINDINGS: Brain: Again seen are extensive foci of FLAIR signal abnormality in
the juxtacortical and periventricular white matter most in keeping
with multiple sclerosis. There are multiple new lesions, most
notably in the right frontoparietal region (16-6), right superior
frontal gyrus near the vertex (12-22), right corona radiata (12-18),
and left frontal lobe white matter (12-16).

There are numerous enhancing lesions, the largest lesion in the
right frontoparietal region (18-103). At least 9 other
enhancing/active lesions are identified in both cerebral
hemispheres.

There is no evidence of acute intracranial hemorrhage or infarct.
There is no acute extra-axial fluid collection. Background
parenchymal volume is normal. The ventricles are normal in size.

There is no solid mass lesion. There is no mass effect or midline
shift.

Vascular: Normal flow voids.

Skull and upper cervical spine: Normal marrow signal.

Sinuses/Orbits: The paranasal sinuses are clear. The globes and
orbits are unremarkable.

Other: None.
IMPRESSION: Numerous white matter lesions throughout the supratentorial brain as
above consistent with multiple sclerosis, with increased plaque
burden since [KF] and numerous foci of active demyelination.

These results will be called to the ordering clinician or
representative by the Radiologist Assistant, and communication
documented in the PACS or [REDACTED].

## 2021-08-26 MED ORDER — GADOBENATE DIMEGLUMINE 529 MG/ML IV SOLN
19.0000 mL | Freq: Once | INTRAVENOUS | Status: AC | PRN
Start: 2021-08-26 — End: 2021-08-26
  Administered 2021-08-26: 19 mL via INTRAVENOUS

## 2022-06-22 DIAGNOSIS — G35 Multiple sclerosis: Secondary | ICD-10-CM | POA: Diagnosis not present

## 2022-09-01 DIAGNOSIS — E782 Mixed hyperlipidemia: Secondary | ICD-10-CM | POA: Diagnosis not present

## 2022-09-01 DIAGNOSIS — I10 Essential (primary) hypertension: Secondary | ICD-10-CM | POA: Diagnosis not present

## 2022-09-01 DIAGNOSIS — Z0000 Encounter for general adult medical examination without abnormal findings: Secondary | ICD-10-CM | POA: Diagnosis not present

## 2022-09-01 DIAGNOSIS — G35 Multiple sclerosis: Secondary | ICD-10-CM | POA: Diagnosis not present

## 2022-11-24 DIAGNOSIS — E041 Nontoxic single thyroid nodule: Secondary | ICD-10-CM | POA: Diagnosis not present

## 2022-12-21 DIAGNOSIS — E041 Nontoxic single thyroid nodule: Secondary | ICD-10-CM | POA: Diagnosis not present

## 2022-12-23 DIAGNOSIS — G35 Multiple sclerosis: Secondary | ICD-10-CM | POA: Diagnosis not present

## 2023-01-10 DIAGNOSIS — R300 Dysuria: Secondary | ICD-10-CM | POA: Diagnosis not present

## 2023-01-10 DIAGNOSIS — G35 Multiple sclerosis: Secondary | ICD-10-CM | POA: Diagnosis not present

## 2023-01-10 DIAGNOSIS — R102 Pelvic and perineal pain: Secondary | ICD-10-CM | POA: Diagnosis not present

## 2023-01-11 ENCOUNTER — Ambulatory Visit
Admission: RE | Admit: 2023-01-11 | Discharge: 2023-01-11 | Disposition: A | Discharge status: Home / Self Care | Payer: Medicaid Other | Source: Ambulatory Visit | Attending: Family Medicine | Admitting: Family Medicine

## 2023-01-11 ENCOUNTER — Other Ambulatory Visit: Payer: Self-pay | Admitting: Family Medicine

## 2023-01-11 DIAGNOSIS — D72829 Elevated white blood cell count, unspecified: Secondary | ICD-10-CM | POA: Diagnosis not present

## 2023-01-11 DIAGNOSIS — R1032 Left lower quadrant pain: Secondary | ICD-10-CM | POA: Diagnosis not present

## 2023-01-11 MED ORDER — IOHEXOL 300 MG/ML  SOLN
100.0000 mL | Freq: Once | INTRAMUSCULAR | Status: AC | PRN
  Administered 2023-01-11: 100 mL via INTRAVENOUS

## 2023-01-13 DIAGNOSIS — D72829 Elevated white blood cell count, unspecified: Secondary | ICD-10-CM | POA: Diagnosis not present

## 2023-01-16 DIAGNOSIS — N9489 Other specified conditions associated with female genital organs and menstrual cycle: Secondary | ICD-10-CM | POA: Diagnosis not present

## 2023-02-02 DIAGNOSIS — N949 Unspecified condition associated with female genital organs and menstrual cycle: Secondary | ICD-10-CM | POA: Diagnosis not present

## 2023-05-24 DIAGNOSIS — E782 Mixed hyperlipidemia: Secondary | ICD-10-CM | POA: Diagnosis not present

## 2023-05-24 DIAGNOSIS — I10 Essential (primary) hypertension: Secondary | ICD-10-CM | POA: Diagnosis not present

## 2023-05-24 DIAGNOSIS — G35 Multiple sclerosis: Secondary | ICD-10-CM | POA: Diagnosis not present

## 2023-05-24 DIAGNOSIS — D72829 Elevated white blood cell count, unspecified: Secondary | ICD-10-CM | POA: Diagnosis not present

## 2023-05-31 DIAGNOSIS — R5383 Other fatigue: Secondary | ICD-10-CM | POA: Diagnosis not present

## 2023-05-31 DIAGNOSIS — D72829 Elevated white blood cell count, unspecified: Secondary | ICD-10-CM | POA: Diagnosis not present

## 2023-05-31 DIAGNOSIS — G35 Multiple sclerosis: Secondary | ICD-10-CM | POA: Diagnosis not present

## 2023-05-31 DIAGNOSIS — I10 Essential (primary) hypertension: Secondary | ICD-10-CM | POA: Diagnosis not present

## 2023-05-31 DIAGNOSIS — E782 Mixed hyperlipidemia: Secondary | ICD-10-CM | POA: Diagnosis not present

## 2023-06-19 DIAGNOSIS — E559 Vitamin D deficiency, unspecified: Secondary | ICD-10-CM | POA: Diagnosis not present

## 2023-06-19 DIAGNOSIS — G35 Multiple sclerosis: Secondary | ICD-10-CM | POA: Diagnosis not present

## 2023-06-19 DIAGNOSIS — Z79899 Other long term (current) drug therapy: Secondary | ICD-10-CM | POA: Diagnosis not present

## 2023-06-22 ENCOUNTER — Other Ambulatory Visit: Payer: Self-pay | Admitting: Neurology

## 2023-06-22 DIAGNOSIS — G35 Multiple sclerosis: Secondary | ICD-10-CM

## 2023-07-18 ENCOUNTER — Ambulatory Visit
Admission: RE | Admit: 2023-07-18 | Discharge: 2023-07-18 | Disposition: A | Discharge status: Home / Self Care | Source: Ambulatory Visit | Attending: Neurology | Admitting: Neurology

## 2023-07-18 DIAGNOSIS — G35 Multiple sclerosis: Secondary | ICD-10-CM

## 2023-07-18 MED ORDER — GADOPICLENOL 0.5 MMOL/ML IV SOLN
7.5000 mL | Freq: Once | INTRAVENOUS | Status: AC | PRN
  Administered 2023-07-18: 7.5 mL via INTRAVENOUS

## 2023-08-31 DIAGNOSIS — D72829 Elevated white blood cell count, unspecified: Secondary | ICD-10-CM | POA: Diagnosis not present

## 2023-08-31 DIAGNOSIS — E782 Mixed hyperlipidemia: Secondary | ICD-10-CM | POA: Diagnosis not present

## 2023-08-31 DIAGNOSIS — G35 Multiple sclerosis: Secondary | ICD-10-CM | POA: Diagnosis not present

## 2023-08-31 DIAGNOSIS — I10 Essential (primary) hypertension: Secondary | ICD-10-CM | POA: Diagnosis not present

## 2023-08-31 DIAGNOSIS — Z79899 Other long term (current) drug therapy: Secondary | ICD-10-CM | POA: Diagnosis not present

## 2023-08-31 DIAGNOSIS — R5383 Other fatigue: Secondary | ICD-10-CM | POA: Diagnosis not present

## 2023-09-07 DIAGNOSIS — R5383 Other fatigue: Secondary | ICD-10-CM | POA: Diagnosis not present

## 2023-09-07 DIAGNOSIS — Z1331 Encounter for screening for depression: Secondary | ICD-10-CM | POA: Diagnosis not present

## 2023-09-07 DIAGNOSIS — I10 Essential (primary) hypertension: Secondary | ICD-10-CM | POA: Diagnosis not present

## 2023-09-07 DIAGNOSIS — G35 Multiple sclerosis: Secondary | ICD-10-CM | POA: Diagnosis not present

## 2023-09-07 DIAGNOSIS — Z0000 Encounter for general adult medical examination without abnormal findings: Secondary | ICD-10-CM | POA: Diagnosis not present

## 2023-09-07 DIAGNOSIS — E782 Mixed hyperlipidemia: Secondary | ICD-10-CM | POA: Diagnosis not present

## 2023-12-20 DIAGNOSIS — G35 Multiple sclerosis: Secondary | ICD-10-CM | POA: Diagnosis not present

## 2023-12-20 DIAGNOSIS — E559 Vitamin D deficiency, unspecified: Secondary | ICD-10-CM | POA: Diagnosis not present

## 2023-12-26 DIAGNOSIS — G35 Multiple sclerosis: Secondary | ICD-10-CM | POA: Diagnosis not present

## 2024-03-05 DIAGNOSIS — R5383 Other fatigue: Secondary | ICD-10-CM | POA: Diagnosis not present

## 2024-03-05 DIAGNOSIS — Z79899 Other long term (current) drug therapy: Secondary | ICD-10-CM | POA: Diagnosis not present

## 2024-03-05 DIAGNOSIS — E782 Mixed hyperlipidemia: Secondary | ICD-10-CM | POA: Diagnosis not present

## 2024-03-05 DIAGNOSIS — I10 Essential (primary) hypertension: Secondary | ICD-10-CM | POA: Diagnosis not present

## 2024-03-05 DIAGNOSIS — G35D Multiple sclerosis, unspecified: Secondary | ICD-10-CM | POA: Diagnosis not present
